# Patient Record
Sex: Female | Born: 1970
Health system: Southern US, Community
[De-identification: ages and names within clinical notes are randomized; demographics above are authoritative.]

## PROBLEM LIST (undated history)

## (undated) DIAGNOSIS — I10 Essential (primary) hypertension: Secondary | ICD-10-CM

## (undated) DIAGNOSIS — N63 Unspecified lump in unspecified breast: Secondary | ICD-10-CM

## (undated) DIAGNOSIS — Z803 Family history of malignant neoplasm of breast: Secondary | ICD-10-CM

## (undated) DIAGNOSIS — R42 Dizziness and giddiness: Secondary | ICD-10-CM

## (undated) DIAGNOSIS — K2970 Gastritis, unspecified, without bleeding: Secondary | ICD-10-CM

## (undated) DIAGNOSIS — Z800 Family history of malignant neoplasm of digestive organs: Secondary | ICD-10-CM

## (undated) DIAGNOSIS — K219 Gastro-esophageal reflux disease without esophagitis: Secondary | ICD-10-CM

## (undated) DIAGNOSIS — B009 Herpesviral infection, unspecified: Secondary | ICD-10-CM

## (undated) HISTORY — DX: Dizziness and giddiness: R42

## (undated) HISTORY — DX: Unspecified lump in unspecified breast: N63.0

## (undated) HISTORY — DX: Essential (primary) hypertension: I10

## (undated) HISTORY — DX: Gastro-esophageal reflux disease without esophagitis: K21.9

## (undated) HISTORY — DX: Herpesviral infection, unspecified: B00.9

## (undated) HISTORY — DX: Family history of malignant neoplasm of digestive organs: Z80.0

## (undated) HISTORY — DX: Family history of malignant neoplasm of breast: Z80.3

## (undated) HISTORY — DX: Gastritis, unspecified, without bleeding: K29.70

---

## 2002-03-26 DIAGNOSIS — K2970 Gastritis, unspecified, without bleeding: Secondary | ICD-10-CM

## 2002-03-26 HISTORY — DX: Gastritis, unspecified, without bleeding: K29.70

## 2006-08-25 ENCOUNTER — Emergency Department: Payer: Self-pay | Admitting: Emergency Medicine

## 2006-09-05 ENCOUNTER — Emergency Department: Payer: Self-pay | Admitting: Unknown Physician Specialty

## 2007-01-27 ENCOUNTER — Ambulatory Visit: Payer: Self-pay | Admitting: Unknown Physician Specialty

## 2007-01-30 ENCOUNTER — Ambulatory Visit: Payer: Self-pay | Admitting: Unknown Physician Specialty

## 2008-03-26 HISTORY — PX: TUBAL LIGATION: SHX77

## 2008-03-26 HISTORY — PX: ABLATION: SHX5711

## 2010-03-26 DIAGNOSIS — I10 Essential (primary) hypertension: Secondary | ICD-10-CM

## 2010-03-26 HISTORY — DX: Essential (primary) hypertension: I10

## 2011-09-24 ENCOUNTER — Ambulatory Visit: Payer: Self-pay

## 2011-10-08 HISTORY — PX: BREAST BIOPSY: SHX20

## 2012-01-27 ENCOUNTER — Emergency Department: Payer: Self-pay | Admitting: Emergency Medicine

## 2012-01-27 LAB — URINALYSIS, COMPLETE
Bacteria: NONE SEEN
Ketone: NEGATIVE
Leukocyte Esterase: NEGATIVE
Nitrite: NEGATIVE
Ph: 9 (ref 4.5–8.0)
Protein: NEGATIVE
RBC,UR: <1 /HPF (ref 0–5)

## 2012-01-27 LAB — COMPREHENSIVE METABOLIC PANEL
Albumin: 4 g/dL (ref 3.4–5.0)
Alkaline Phosphatase: 57 U/L (ref 50–136)
Anion Gap: 7 (ref 7–16)
BUN: 16 mg/dL (ref 7–18)
Creatinine: 0.85 mg/dL (ref 0.60–1.30)
EGFR (African American): >60
Glucose: 80 mg/dL (ref 65–99)
Potassium: 3.5 mmol/L (ref 3.5–5.1)
SGOT(AST): 21 U/L (ref 15–37)
Sodium: 136 mmol/L (ref 136–145)
Total Protein: 8.6 g/dL — ABNORMAL HIGH (ref 6.4–8.2)

## 2012-01-27 LAB — CBC
MCH: 28.3 pg (ref 26.0–34.0)
MCHC: 32.7 g/dL (ref 32.0–36.0)
RDW: 13.5 % (ref 11.5–14.5)

## 2012-01-27 LAB — PREGNANCY, URINE: Pregnancy Test, Urine: NEGATIVE m[IU]/mL

## 2013-09-15 DIAGNOSIS — N920 Excessive and frequent menstruation with regular cycle: Secondary | ICD-10-CM | POA: Insufficient documentation

## 2013-09-15 DIAGNOSIS — A6000 Herpesviral infection of urogenital system, unspecified: Secondary | ICD-10-CM | POA: Insufficient documentation

## 2013-09-16 ENCOUNTER — Other Ambulatory Visit: Payer: Commercial Indemnity

## 2013-09-16 ENCOUNTER — Ambulatory Visit (INDEPENDENT_AMBULATORY_CARE_PROVIDER_SITE_OTHER): Payer: Commercial Indemnity | Admitting: General Surgery

## 2013-09-16 ENCOUNTER — Encounter: Payer: Self-pay | Admitting: General Surgery

## 2013-09-16 ENCOUNTER — Ambulatory Visit: Payer: Self-pay | Admitting: General Surgery

## 2013-09-16 VITALS — BP 138/68 | HR 78 | Resp 15 | Ht 64.0 in | Wt 161.0 lb

## 2013-09-16 DIAGNOSIS — R001 Bradycardia, unspecified: Secondary | ICD-10-CM | POA: Insufficient documentation

## 2013-09-16 DIAGNOSIS — N63 Unspecified lump in unspecified breast: Secondary | ICD-10-CM

## 2013-09-16 DIAGNOSIS — N6320 Unspecified lump in the left breast, unspecified quadrant: Secondary | ICD-10-CM

## 2013-09-16 NOTE — Patient Instructions (Signed)
Breast Biopsy A breast biopsy is a procedure where a sample of breast tissue is removed from your breast. The tissue is examined under a microscope to see if cancerous cells are present. A breast biopsy is done when there is:  Any undiagnosed breast mass (tumor).  Nipple abnormalities, dimpling, crusting, or ulcerations.  Abnormal discharge from the nipple, especially blood.  Redness, swelling, and pain of the breast.  Calcium deposits (calcifications) or abnormalities seen on a mammogram, ultrasound result, or results of magnetic resonance imaging (MRI).  Suspicious changes in the breast seen on your mammogram. If the tumor is found to be cancerous (malignant), a breast biopsy can help to determine what the best treatment is for you. There are many different types of breast biopsies. Talk to your caregiver about your options and which type is best for you. LET YOUR CAREGIVER KNOW ABOUT:  Allergies to food or medicine.  Medicines taken, including vitamins, herbs, eyedrops, over-the-counter medicines, and creams.  Use of steroids (by mouth or creams).  Previous problems with anesthetics or numbing medicines.  History of bleeding problems or blood clots.  Previous surgery.  Other health problems, including diabetes and kidney problems.  Any recent colds or infections.  Possibility of pregnancy, if this applies. RISKS AND COMPLICATIONS   Bleeding.  Infection.  Allergy to medicines.  Bruising and swelling of the breast.  Alteration in the shape of the breast.  Not finding the lump or abnormality.  Needing more surgery. BEFORE THE PROCEDURE  Arrange for someone to drive you home after the procedure.  Do not smoke for 2 weeks before the procedure. Stop smoking, if you smoke.  Do not drink alcohol for 24 hours before procedure.  Wear a good support bra to the procedure. PROCEDURE  You may be given a medicine to numb the breast area (local anesthesia) or a medicine  to make you sleep (general anesthesia) during the procedure. The following are the different types of biopsies that can be performed.   Fine-needle aspiration--A thin needle is attached to a syringe and inserted into the breast lump. Fluid and cells are removed and then looked at under a microscope. If the breast lump cannot be felt, an ultrasound may be used to help locate the lump and place the needle in the correct area.   Core needle biopsy--A wide, hollow needle (core needle) is inserted into the breast lump 3-6 times to get tissue samples or cores. The samples are removed. The needle is usually placed in the correct area by using an ultrasound or X-ray.   Stereotactic biopsy--X-ray equipment and a computer are used to analyze X-ray pictures of the breast lump. The computer then finds exactly where the core needle needs to be inserted. Tissue samples are removed.   Vacuum-assisted biopsy--A small incision (less than  inch) is made in your breast. A biopsy device that includes a hollow needle and vacuum is passed through the incision and into the breast tissue. The vacuum gently draws abnormal breast tissue into the needle to remove it. This type of biopsy removes a larger tissue sample than a regular core needle biopsy. No stitches are needed, and there is usually little scarring.  Ultrasound-guided core needle biopsy--A high frequency ultrasound helps guide the core needle to the area of the mass or abnormality. An incision is made to insert the needle. Tissue samples are removed.  Open biopsy--A larger incision is made in the breast. Your caregiver will attempt to remove the whole breast lump or   as much as possible. AFTER THE PROCEDURE  You will be taken to the recovery area. If you are doing well and have no problems, you will be allowed to go home.  You may notice bruising on your breast. This is normal.  Your caregiver may apply a pressure dressing on your breast for 24-48 hours. A  pressure dressing is a bandage that is wrapped tightly around the chest to stop fluid from collecting underneath tissues. Document Released: 03/12/2005 Document Revised: 07/07/2012 Document Reviewed: 04/12/2011 Exit[employer] Patient Information 2015 [employer]. This information is not intended to replace advice given to you by your health care provider. Make sure you discuss any questions you have with your health care provider.   

## 2013-09-16 NOTE — Progress Notes (Signed)
Patient ID: Christine Hoffman, female   DOB: January 15, 1971, 43 y.o.   MRN: [id number]  Chief Complaint  Patient presents with  . Other    left breast mass    HPI Christine Hoffman is a 43 y.o. female  Here for left mass found on her annual mammogram completed at [facility] on 09/08/13. The patient denies any breast pain or tenderness and is unable to feel the mass. She normally gets annual mammograms and does monthly breast exams. Previous biopsy was done on the left breast in 2013 in this office at which time suitable angiomatous stromal hyperplasia was identified.. Patient is currently wearing a 24 hour halter monitor due to irregular heart rhythm.  The patient is accompanied today by her husband of 2 months who was present for the interview and exam. HPI  Past Medical History  Diagnosis Date  . Gastritis 2004  . Hypertension 2012  . Lump or mass in breast   . GERD (gastroesophageal reflux disease)     Past Surgical History  Procedure Laterality Date  . Ablation  2010  . Tubal ligation  2010  . 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    Family History  Problem Relation Age of Onset  . Breast cancer Mother 53  . Ovarian cancer Maternal Aunt   . Ovarian cancer  33    Niece Fathers side    Social History History  Substance Use Topics  . Smoking status: Never Smoker   . Smokeless tobacco: Never Used  . Alcohol Use: Yes    No Known Allergies  No current outpatient prescriptions on file.   No current facility-administered medications for this visit.    Review of Systems Review of Systems  Constitutional: Negative.   Respiratory: Negative.   Cardiovascular: Negative.     Blood pressure 138/68, pulse 78, resp. rate 15, height 5\' 4"  (1.626 m), weight 161 lb (73.029 kg), last menstrual period 08/28/2013.  Physical Exam Physical Exam  Constitutional: She is oriented to person, place, and time. She appears  well-developed and well-nourished.  Eyes: Conjunctivae are normal. No scleral icterus.  Neck: Neck supple.  Cardiovascular: Normal rate, regular rhythm and normal heart sounds.   Pulmonary/Chest: Effort normal and breath sounds normal. Right breast exhibits no inverted nipple, no mass, no nipple discharge, no skin change and no tenderness. Left breast exhibits mass (2 o'clock 6 cm from the nipple, 2 cm in size ). Left breast exhibits no inverted nipple, no nipple discharge, no skin change and no tenderness.    Lymphadenopathy:    She has no cervical adenopathy.    She has no axillary adenopathy.  Neurological: She is alert and oriented to person, place, and time.    Data Reviewed Mammogram dated September 08, 2013 was reviewed and compared to the 2013 study. BI-RAD-0. A large nodular density in the outer central portion left breast approximately 3 cm in diameter. Adjacent biopsy clip is seen. Diagnostic mammogram and ultrasound were recommended.   Ultrasound examination of the left breast at the 2 o'clock position, 6 cm from the nipple shows a hypoechoic smoothly lobulated 0.9 x 3.0 x 3.1 cm mass corresponding to the mammogram and clinical exam.  At the time of her October 08, 2011 exam this measured approximately 1.1 cm in diameter.  Assessment    Enlarging left breast mass, likely no change in histology.       Plan    Options for management were reviewed:  1) repeat vacuum-assisted biopsy with attempt remove the entire lesion versus 2) operative excision. Pros and cons of each were reviewed. The main difference between the 2 procedures would be the increased cost with operative reexcision but less likelihood of recurrence. At this time the patient is comfortable with plans for vacuum excision. As she is presently having a Holter monitor exam, the biopsy will be deferred until the device has been removed to minimize interference.     Ref: Mack Hook PCP: Dr Claris Gower,  Forest Gleason 09/16/2013, 8:59 PM

## 2013-09-17 ENCOUNTER — Telehealth: Payer: Self-pay | Admitting: *Deleted

## 2013-09-17 NOTE — Telephone Encounter (Signed)
Patient called back and would like to have the excision done at the hospital, she wants it completely gone, she does not want it to come back. I told her I would let Dr Bary Castilla know and start the process of getting it scheduled. Office [facility] biopsy cancelled.

## 2013-09-18 ENCOUNTER — Telehealth: Payer: Self-pay

## 2013-09-18 NOTE — Telephone Encounter (Signed)
Message left for patient to call back to schedule breast surgery.

## 2013-09-21 ENCOUNTER — Telehealth: Payer: Self-pay | Admitting: *Deleted

## 2013-09-21 NOTE — Telephone Encounter (Signed)
Spoke with patient about scheduling surgery. Patient is scheduled for surgery at [facility] on 10/01/13. She will pre admit by phone. Patient is aware to call the day before surgery to get her arrival time. Instructions have been mailed to her. Patient is aware of date and instructions.

## 2013-09-21 NOTE — Telephone Encounter (Signed)
Pt was seen in the office by Dr.Byrnett about her LT breast mass, she was wondering if she does the removal of the tissue will there be a chance it will come back.

## 2013-09-22 ENCOUNTER — Telehealth: Payer: Self-pay | Admitting: *Deleted

## 2013-09-22 ENCOUNTER — Other Ambulatory Visit: Payer: Self-pay | Admitting: General Surgery

## 2013-09-22 DIAGNOSIS — N6320 Unspecified lump in the left breast, unspecified quadrant: Secondary | ICD-10-CM

## 2013-09-22 NOTE — Telephone Encounter (Signed)
I talked with the patient continue with planned excision.

## 2013-09-22 NOTE — Telephone Encounter (Signed)
Excision is scheduled for [facility] on 10-01-13.

## 2013-09-22 NOTE — Telephone Encounter (Signed)
Pt is returning your call back, she will be in a meeting till 61

## 2013-10-01 ENCOUNTER — Ambulatory Visit: Payer: Self-pay | Admitting: General Surgery

## 2013-10-01 DIAGNOSIS — D249 Benign neoplasm of unspecified breast: Secondary | ICD-10-CM

## 2013-10-02 ENCOUNTER — Encounter: Payer: Self-pay | Admitting: General Surgery

## 2013-10-05 ENCOUNTER — Ambulatory Visit: Payer: Commercial Indemnity | Admitting: General Surgery

## 2013-10-05 LAB — PATHOLOGY REPORT

## 2013-10-06 ENCOUNTER — Encounter: Payer: Self-pay | Admitting: General Surgery

## 2013-10-06 ENCOUNTER — Telehealth: Payer: Self-pay

## 2013-10-06 NOTE — Telephone Encounter (Signed)
Message copied by Lesly Rubenstein on Tue Oct 06, 2013  8:29 AM ------      Message from: [place], Forest Gleason      Created: Tue Oct 06, 2013  7:07 AM       Notify patient biopsy of the left breast completed 7/9 was fine. Will review at f/u. Thanks. ------

## 2013-10-06 NOTE — Telephone Encounter (Signed)
Message left for patient to call back for results.

## 2013-10-06 NOTE — Telephone Encounter (Signed)
Notified patient as instructed, patient pleased. Discussed follow-up appointments, patient agrees  

## 2013-10-07 ENCOUNTER — Ambulatory Visit: Payer: Commercial Indemnity | Admitting: General Surgery

## 2013-10-07 ENCOUNTER — Encounter: Payer: Self-pay | Admitting: General Surgery

## 2013-10-13 ENCOUNTER — Ambulatory Visit (INDEPENDENT_AMBULATORY_CARE_PROVIDER_SITE_OTHER): Payer: Self-pay | Admitting: General Surgery

## 2013-10-13 ENCOUNTER — Encounter: Payer: Self-pay | Admitting: General Surgery

## 2013-10-13 VITALS — BP 110/80 | HR 64 | Resp 12 | Ht 64.0 in | Wt 163.0 lb

## 2013-10-13 DIAGNOSIS — N6320 Unspecified lump in the left breast, unspecified quadrant: Secondary | ICD-10-CM

## 2013-10-13 DIAGNOSIS — N63 Unspecified lump in unspecified breast: Secondary | ICD-10-CM

## 2013-10-13 NOTE — Patient Instructions (Addendum)
Continue self breast exams. Call office for any new breast issues or concerns. Heating pad as needed for comfort. Follow up in 3 months.

## 2013-10-13 NOTE — Progress Notes (Signed)
Patient ID: Christine Hoffman, female   DOB: 1970/10/04, 43 y.o.   MRN: [id number]  Chief Complaint  Patient presents with  . Routine Post Op    left breast biopsy    HPI Christine Hoffman is a 43 y.o. female who presents for a post op left breast biopsy. The biopsy was performed on 10/01/13. No new issues at this time.   *HPI  Past Medical History  Diagnosis Date  . Gastritis 2004  . Hypertension 2012  . Lump or mass in breast   . GERD (gastroesophageal reflux disease)     Past Surgical History  Procedure Laterality Date  . Ablation  2010  . Tubal ligation  2010  . 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    Family History  Problem Relation Age of Onset  . Breast cancer Mother 14  . Ovarian cancer Maternal Aunt   . Ovarian cancer  82    Niece Fathers side    Social History History  Substance Use Topics  . Smoking status: Never Smoker   . Smokeless tobacco: Never Used  . Alcohol Use: Yes    No Known Allergies  No current outpatient prescriptions on file.   No current facility-administered medications for this visit.    Review of Systems Review of Systems  Constitutional: Negative.   Respiratory: Negative.   Cardiovascular: Negative.     Blood pressure 110/80, pulse 64, resp. rate 12, height 5\' 4"  (1.626 m), weight 163 lb (73.936 kg), last menstrual period 09/28/2013.  Physical Exam Physical Exam  Constitutional: She is oriented to person, place, and time. She appears well-developed and well-nourished.  Pulmonary/Chest: Left breast exhibits no inverted nipple, no mass, no nipple discharge and no skin change.  Well healed incision at 3 o'clock minimal thickening noted  Neurological: She is alert and oriented to person, place, and time.  Skin: Skin is warm and dry.    Data Reviewed Biopsy dated 10/08/2011 showed cylindroma stromal hyperplasia with columnar cell changes. No evidence of  malignancy.  Left breast biopsy dated 10/01/2013 showed benign breast tissue with stromal fibrosis, benign breast lobules and admixed fat. Findings consistent with hamartoma or nodular fibroadenomatoid changes.  Assessment    Doing well status post excision of the left breast mass.    Plan    Local wound care options reviewed.    Heating pad as needed for comfort. Follow up in 3 months.   PCP: Philbert Riser., Gaye Pollack 10/14/2013, 12:52 PM

## 2014-01-18 ENCOUNTER — Encounter: Payer: Self-pay | Admitting: General Surgery

## 2014-01-18 ENCOUNTER — Ambulatory Visit (INDEPENDENT_AMBULATORY_CARE_PROVIDER_SITE_OTHER): Payer: Self-pay | Admitting: General Surgery

## 2014-01-18 VITALS — BP 116/70 | HR 72 | Resp 14 | Ht 65.0 in | Wt 160.0 lb

## 2014-01-18 DIAGNOSIS — N63 Unspecified lump in breast: Secondary | ICD-10-CM

## 2014-01-18 DIAGNOSIS — N6320 Unspecified lump in the left breast, unspecified quadrant: Secondary | ICD-10-CM

## 2014-01-18 NOTE — Progress Notes (Signed)
Patient ID: Christine Hoffman, female   DOB: [birth year], 43 y.o.   MRN: [id number]  Chief Complaint  Patient presents with  . Follow-up    3 month follow up breast    HPI Christine Hoffman is a 43 y.o. female who presents for a 3 month follow up of left breast fibroadenoma. The patient underwent a left breast excision on 10/01/13. No new complaints at this time. Doing well.   HPI  Past Medical History  Diagnosis Date  . Gastritis 2004  . Hypertension 2012  . Lump or mass in breast   . GERD (gastroesophageal reflux disease)     Past Surgical History  Procedure Laterality Date  . Ablation  2010  . Tubal ligation  2010  . 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    Family History  Problem Relation Age of Onset  . Breast cancer Mother 15  . Ovarian cancer Maternal Aunt   . Ovarian cancer  67    Niece Fathers side    Social History History  Substance Use Topics  . Smoking status: Never Smoker   . Smokeless tobacco: Never Used  . Alcohol Use: Yes    No Known Allergies  No current outpatient prescriptions on file.   No current facility-administered medications for this visit.    Review of Systems Review of Systems  Constitutional: Negative.   Respiratory: Negative.   Cardiovascular: Negative.     Blood pressure 116/70, pulse 72, resp. rate 14, height 5\' 5"  (1.651 m), weight 160 lb (72.576 kg), last menstrual period 01/18/2014.  Physical Exam Physical Exam  Constitutional: She is oriented to person, place, and time. She appears well-developed and well-nourished.  Eyes: Conjunctivae are normal. No scleral icterus.  Neck: Neck supple.  Cardiovascular: Normal rate and normal heart sounds.  An irregular rhythm present.  Pulmonary/Chest: Effort normal and breath sounds normal. Right breast exhibits no inverted nipple, no mass, no nipple discharge, no skin change and no tenderness. Left breast exhibits no  inverted nipple, no mass, no nipple discharge, no skin change and no tenderness.    Left breast thickening at 3 o 'clock site of recent excision.  Lymphadenopathy:    She has no cervical adenopathy.    She has no axillary adenopathy.  Neurological: She is alert and oriented to person, place, and time.  Skin: Skin is warm and dry.    Data Reviewed 10/01/2013 left breast biopsy showed benign breast tissue with stromal fibrosis with admixed fat. Benign hamartoma and nodular fibroadenomatoid changes were likely.  Assessment    Benign breast exam.    Plan    Patient to return as needed.     PCP: Philbert Riser., Gaye Pollack 01/18/2014, 7:47 PM

## 2014-01-18 NOTE — Patient Instructions (Signed)
Patient to return as needed. 

## 2014-01-25 ENCOUNTER — Encounter: Payer: Self-pay | Admitting: General Surgery

## 2014-05-30 ENCOUNTER — Ambulatory Visit: Payer: Self-pay | Admitting: Emergency Medicine

## 2014-05-30 ENCOUNTER — Emergency Department: Payer: Self-pay | Admitting: Emergency Medicine

## 2014-06-02 ENCOUNTER — Telehealth: Payer: Self-pay

## 2014-06-02 DIAGNOSIS — R110 Nausea: Secondary | ICD-10-CM | POA: Insufficient documentation

## 2014-06-02 NOTE — Telephone Encounter (Signed)
[place] called back, states tomorrow was "not good enough" per pt, and she has gotten an earlier appt with Dr. Ubaldo Glassing.

## 2014-06-02 NOTE — Telephone Encounter (Signed)
Ok

## 2014-06-02 NOTE — Telephone Encounter (Signed)
Spoke w/ Amy, NP at Dr. Kathaleen Grinder office.  She reports that pt was seen in the ED recently, primary cardiologist [facility], but they are unhappy w/ them and would like to switch to our office.  EKG performed in their office today shows bigeminy and they would like to see if pt can be worked in sooner than Friday 3/11.  Advised her that I will make Dr. Fletcher Anon aware and call her back if pt can be seen sooner.

## 2014-06-04 ENCOUNTER — Ambulatory Visit: Payer: Self-pay | Admitting: Cardiovascular Disease

## 2014-06-04 DIAGNOSIS — I493 Ventricular premature depolarization: Secondary | ICD-10-CM | POA: Insufficient documentation

## 2014-07-07 NOTE — Telephone Encounter (Signed)
This encounter was created in error - please disregard.

## 2014-07-17 NOTE — Op Note (Signed)
PATIENT NAME:  Christine Hoffman, Christine Hoffman MR#:  [id number] DATE OF BIRTH:  January 21, 1971  DATE OF PROCEDURE:  10/01/2013  PREOPERATIVE DIAGNOSIS: Left breast fibroadenoma.   POSTOPERATIVE DIAGNOSIS:   Left breast fibroadenoma.   OPERATIVE PROCEDURE: Excision of left breast fibroadenoma.   SURGEON: Hervey Ard, MD    ANESTHESIA: General by LMA under Dr. Marcello Moores, Marcaine 0.5% with 1:200,000 units of epinephrine, 30 mL local infiltration.  CLINICAL NOTE: A 44 year old woman had previously undergone core biopsy of a left breast mass in the 3 o'clock position showing evidence of a fibroadenoma. The area has enlarged over time and she desired formal excision.   OPERATIVE NOTE: With the patient under adequate general anesthesia, the breast was prepped with ChloraPrep and draped. Ultrasound was used to confirm the location. Marcaine was infiltrated for postoperative analgesia and hemostasis. A radial incision was made at the 3 o'clock position of the left breast and the skin and subcutaneous tissue divided.  A layer of adipose tissue was excised and then the mass itself was approached. This was approximately 3 cm in maximal diameter. It was orientated and sent fresh per protocol for analysis. The wound was closed in layers with 2-0 Vicryl figure-of-eight sutures. The skin was closed with a running 4-0 Vicryl subcuticular suture. Benzoin and Steri-Strips were applied followed by a Telfa pad, fluff gauze, Kerlix, and an Ace wrap.   The patient tolerated the procedure well.    ____________________________ Robert Bellow, MD jwb:dd D: 10/01/2013 21:04:32 ET T: 10/02/2013 02:36:18 ET JOB#: [id number]  cc: Robert Bellow, MD, <Dictator> Arlis Porta., MD Jesus Genera. Danise Mina, CNM Lillyona Polasek Amedeo Kinsman MD ELECTRONICALLY SIGNED 10/02/2013 13:05

## 2015-03-29 ENCOUNTER — Ambulatory Visit: Payer: Self-pay | Admitting: Family Medicine

## 2015-04-06 ENCOUNTER — Ambulatory Visit (INDEPENDENT_AMBULATORY_CARE_PROVIDER_SITE_OTHER): Payer: Managed Care, Other (non HMO) | Admitting: Family Medicine

## 2015-04-06 VITALS — BP 131/85 | HR 85 | Temp 98.2°F | Resp 16 | Ht 64.0 in | Wt 162.0 lb

## 2015-04-06 DIAGNOSIS — R5382 Chronic fatigue, unspecified: Secondary | ICD-10-CM | POA: Diagnosis not present

## 2015-04-06 DIAGNOSIS — R42 Dizziness and giddiness: Secondary | ICD-10-CM | POA: Diagnosis not present

## 2015-04-06 DIAGNOSIS — I493 Ventricular premature depolarization: Secondary | ICD-10-CM | POA: Diagnosis not present

## 2015-04-06 DIAGNOSIS — R110 Nausea: Secondary | ICD-10-CM

## 2015-04-06 MED ORDER — ONDANSETRON HCL 4 MG PO TABS: 4.0000 mg | ORAL_TABLET | Freq: Three times a day (TID) | ORAL | Status: DC | PRN

## 2015-04-06 MED ORDER — FLUTICASONE PROPIONATE 50 MCG/ACT NA SUSP: 2.0000 | Freq: Every day | NASAL | Status: DC

## 2015-04-06 MED ORDER — PSEUDOEPHEDRINE HCL 30 MG PO TABS: 30.0000 mg | ORAL_TABLET | ORAL | Status: DC | PRN

## 2015-04-06 NOTE — Patient Instructions (Signed)
We will check some labs today to determine the course of your symptoms. Please continue with Dr. Sonny Masters recommendations. We will consider PT for your symptoms if they don't improve.  Please seek immediate medical attention at ER or Urgent Care if you develop: Chest pain, pressure or tightness. Shortness of breath accompanied by nausea or diaphoresis Visual changes Numbness or tingling on one side of the body Facial droop Altered mental status Or any concerning symptoms.

## 2015-04-06 NOTE — Assessment & Plan Note (Signed)
Pt advised to follow advise of her ENT advice to help with symptoms. Check labs to rule out other causes of dizziness and nausea.  Consider vestibular rehab.

## 2015-04-06 NOTE — Progress Notes (Signed)
Subjective:    Patient ID: Christine Hoffman, female    DOB: [birth year], 45 y.o.   MRN: [id number]  HPI: Christine Hoffman is a 45 y.o. female presenting on 04/06/2015 for Nausea   HPI  Pt presents for nausea. Wakes up in the morning feeling dizzy and nauseated. Sometimes nauseated with position changes. Saw ENT for vertigo- taking sudafed and nasal spray. Called Dr. Virgia Land  Told her to start flonase and sudafed for symptoms. Mild relief- however symptoms still cause nausea.  Pt is still having palpitations- had a holter monitor. Was throwing frequent PVCs per Dr. Bethanne Ginger assessment. Symptoms are still present but occurring less frequently. No CP, SOB.  No syncope. Occasional palpitations when she is stressed. Not occurring daily.   Pt also reporting feeling fatigued frequently.  Her husband terms it low on gas.    Past Medical History  Diagnosis Date  . Gastritis 2004  . Hypertension 2012  . Lump or mass in breast   . GERD (gastroesophageal reflux disease)     No current outpatient prescriptions on file prior to visit.   No current facility-administered medications on file prior to visit.    Review of Systems  Constitutional: Negative for fever and chills.  HENT: Negative.  Negative for ear discharge and ear pain.   Respiratory: Negative for cough, chest tightness and wheezing.   Cardiovascular: Positive for palpitations (occasional. ). Negative for chest pain and leg swelling.  Gastrointestinal: Positive for nausea. Negative for vomiting, abdominal pain, diarrhea and constipation.  Endocrine: Negative.  Negative for cold intolerance, heat intolerance, polydipsia, polyphagia and polyuria.  Genitourinary: Negative for dysuria and difficulty urinating.  Musculoskeletal: Negative.   Neurological: Positive for dizziness. Negative for light-headedness and numbness.  Psychiatric/Behavioral: Negative.    Per HPI unless specifically indicated above     Objective:      BP 131/85 mmHg  Pulse 85  Temp(Src) 98.2 F (36.8 C) (Oral)  Resp 16  Ht 5\' 4"  (1.626 m)  Wt 162 lb (73.483 kg)  BMI 27.79 kg/m2  LMP 03/02/2015  Wt Readings from Last 3 Encounters:  04/06/15 162 lb (73.483 kg)  01/18/14 160 lb (72.576 kg)  10/13/13 163 lb (73.936 kg)    Physical Exam  Constitutional: She is oriented to person, place, and time. She appears well-developed and well-nourished.  HENT:  Head: Normocephalic and atraumatic.  Right Ear: Hearing and tympanic membrane normal.  Left Ear: Hearing normal. Tympanic membrane is scarred.  Nose: Nose normal. Right sinus exhibits no maxillary sinus tenderness and no frontal sinus tenderness. Left sinus exhibits no maxillary sinus tenderness and no frontal sinus tenderness.  Eyes: EOM are normal. Pupils are equal, round, and reactive to light. Right eye exhibits no nystagmus. Left eye exhibits no nystagmus.  Neck: Neck supple.  Cardiovascular: Normal rate, regular rhythm and normal heart sounds.  Exam reveals no gallop and no friction rub.   No murmur heard. Pulmonary/Chest: Effort normal and breath sounds normal. She has no wheezes. She exhibits no tenderness.  Abdominal: Soft. Normal appearance and bowel sounds are normal. She exhibits no distension and no mass. There is no tenderness. There is no rebound and no guarding.  Musculoskeletal: Normal range of motion. She exhibits no edema or tenderness.  Lymphadenopathy:    She has no cervical adenopathy.  Neurological: She is alert and oriented to person, place, and time.  Skin: Skin is warm and dry.       Assessment & Plan:   Problem List  Items Addressed This Visit      Cardiovascular and Mediastinum   Beat, premature ventricular - Primary    Appear controlled at this time. Consider cardiology referral for further work-up if symptoms are not improved.  Consider beta blocker. Encouraged decreasing caffeine and maintaining hydration.       Relevant Orders   TSH      Other   Vertigo    Pt advised to follow advise of her ENT advice to help with symptoms. Check labs to rule out other causes of dizziness and nausea.  Consider vestibular rehab.        Other Visit Diagnoses    Nausea        Likely 2/2 vertigo. PRN Zofran.     Relevant Medications    ondansetron (ZOFRAN) 4 MG tablet    Other Relevant Orders    Comprehensive Metabolic Panel (CMET)    CBC with Differential    Chronic fatigue        Check TSH and Vitamin D levels.     Relevant Orders    TSH    VITAMIN D 25 Hydroxy (Vit-D Deficiency, Fractures)       Meds ordered this encounter  Medications  . acyclovir (ZOVIRAX) 400 MG tablet    Sig: Take 400 mg by mouth 5 (five) times daily. As needed  . ondansetron (ZOFRAN) 4 MG tablet    Sig: Take 1 tablet (4 mg total) by mouth every 8 (eight) hours as needed for nausea or vomiting.    Dispense:  20 tablet    Refill:  0    Order Specific Question:  Supervising Provider    Answer:  Arlis Porta [phone]  . pseudoephedrine (SUDAFED) 30 MG tablet    Sig: Take 1 tablet (30 mg total) by mouth every 4 (four) hours as needed for congestion.    Dispense:  30 tablet    Refill:  0    Order Specific Question:  Supervising Provider    Answer:  Arlis Porta [phone]  . fluticasone (FLONASE) 50 MCG/ACT nasal spray    Sig: Place 2 sprays into both nostrils daily.    Dispense:  16 g    Refill:  11    Order Specific Question:  Supervising Provider    Answer:  Arlis Porta [phone]      Follow up plan: Return in about 4 weeks (around 05/04/2015) for dizziness. Marland Kitchen

## 2015-04-06 NOTE — Assessment & Plan Note (Signed)
Appear controlled at this time. Consider cardiology referral for further work-up if symptoms are not improved.  Consider beta blocker. Encouraged decreasing caffeine and maintaining hydration.

## 2015-04-09 LAB — CBC WITH DIFFERENTIAL/PLATELET
BASOS: 0 %
Basophils Absolute: 0 10*3/uL (ref 0.0–0.2)
EOS (ABSOLUTE): 0.1 10*3/uL (ref 0.0–0.4)
Eos: 2 %
HEMATOCRIT: 39 % (ref 34.0–46.6)
HEMOGLOBIN: 12.9 g/dL (ref 11.1–15.9)
IMMATURE GRANULOCYTES: 0 %
Immature Grans (Abs): 0 10*3/uL (ref 0.0–0.1)
Lymphocytes Absolute: 1.8 10*3/uL (ref 0.7–3.1)
Lymphs: 39 %
MCH: 27.6 pg (ref 26.6–33.0)
MCHC: 33.1 g/dL (ref 31.5–35.7)
MCV: 84 fL (ref 79–97)
MONOCYTES: 6 %
Monocytes Absolute: 0.3 10*3/uL (ref 0.1–0.9)
NEUTROS PCT: 53 %
Neutrophils Absolute: 2.5 10*3/uL (ref 1.4–7.0)
Platelets: 231 10*3/uL (ref 150–379)
RBC: 4.67 x10E6/uL (ref 3.77–5.28)
RDW: 13.4 % (ref 12.3–15.4)
WBC: 4.7 10*3/uL (ref 3.4–10.8)

## 2015-04-09 LAB — COMPREHENSIVE METABOLIC PANEL
ALK PHOS: 49 IU/L (ref 39–117)
ALT: 13 IU/L (ref 0–32)
AST: 16 IU/L (ref 0–40)
Albumin/Globulin Ratio: 1.2 (ref 1.1–2.5)
Albumin: 4.1 g/dL (ref 3.5–5.5)
BUN/Creatinine Ratio: 13 (ref 9–23)
BUN: 11 mg/dL (ref 6–24)
Bilirubin Total: 0.3 mg/dL (ref 0.0–1.2)
CALCIUM: 9 mg/dL (ref 8.7–10.2)
CO2: 21 mmol/L (ref 18–29)
Chloride: 98 mmol/L (ref 96–106)
Creatinine, Ser: 0.82 mg/dL (ref 0.57–1.00)
GFR calc Af Amer: 101 mL/min/{1.73_m2} (ref >59)
GFR, EST NON AFRICAN AMERICAN: 88 mL/min/{1.73_m2} (ref >59)
GLUCOSE: 92 mg/dL (ref 65–99)
Globulin, Total: 3.5 g/dL (ref 1.5–4.5)
Potassium: 3.9 mmol/L (ref 3.5–5.2)
Sodium: 136 mmol/L (ref 134–144)
TOTAL PROTEIN: 7.6 g/dL (ref 6.0–8.5)

## 2015-04-09 LAB — TSH: TSH: 3.19 u[IU]/mL (ref 0.450–4.500)

## 2015-04-09 LAB — VITAMIN D 25 HYDROXY (VIT D DEFICIENCY, FRACTURES): VIT D 25 HYDROXY: 13.1 ng/mL — AB (ref 30.0–100.0)

## 2015-04-11 ENCOUNTER — Other Ambulatory Visit: Payer: Self-pay | Admitting: Family Medicine

## 2015-04-11 DIAGNOSIS — E559 Vitamin D deficiency, unspecified: Secondary | ICD-10-CM

## 2015-04-11 MED ORDER — ERGOCALCIFEROL 1.25 MG (50000 UT) PO CAPS: 50000.0000 [IU] | ORAL_CAPSULE | ORAL | Status: AC

## 2015-10-14 ENCOUNTER — Encounter: Payer: Self-pay | Admitting: Family Medicine

## 2015-10-14 ENCOUNTER — Ambulatory Visit (INDEPENDENT_AMBULATORY_CARE_PROVIDER_SITE_OTHER): Payer: Managed Care, Other (non HMO) | Admitting: Family Medicine

## 2015-10-14 VITALS — BP 138/82 | HR 41 | Temp 97.9°F | Resp 16 | Ht 64.0 in | Wt 161.8 lb

## 2015-10-14 DIAGNOSIS — A6000 Herpesviral infection of urogenital system, unspecified: Secondary | ICD-10-CM

## 2015-10-14 DIAGNOSIS — R42 Dizziness and giddiness: Secondary | ICD-10-CM | POA: Diagnosis not present

## 2015-10-14 DIAGNOSIS — E559 Vitamin D deficiency, unspecified: Secondary | ICD-10-CM

## 2015-10-14 DIAGNOSIS — N920 Excessive and frequent menstruation with regular cycle: Secondary | ICD-10-CM | POA: Diagnosis not present

## 2015-10-14 DIAGNOSIS — R5382 Chronic fatigue, unspecified: Secondary | ICD-10-CM | POA: Diagnosis not present

## 2015-10-14 DIAGNOSIS — Z0000 Encounter for general adult medical examination without abnormal findings: Secondary | ICD-10-CM | POA: Diagnosis not present

## 2015-10-14 LAB — CBC WITH DIFFERENTIAL/PLATELET
Basophils Absolute: 44 cells/uL (ref 0–200)
Basophils Relative: 1 %
Eosinophils Absolute: 88 cells/uL (ref 15–500)
Eosinophils Relative: 2 %
HCT: 39.2 % (ref 35.0–45.0)
Hemoglobin: 12.6 g/dL (ref 11.7–15.5)
Lymphocytes Relative: 39 %
Lymphs Abs: 1716 cells/uL (ref 850–3900)
MCH: 26.6 pg — ABNORMAL LOW (ref 27.0–33.0)
MCHC: 32.1 g/dL (ref 32.0–36.0)
MCV: 82.7 fL (ref 80.0–100.0)
MPV: 11.2 fL (ref 7.5–12.5)
Monocytes Absolute: 352 cells/uL (ref 200–950)
Monocytes Relative: 8 %
Neutro Abs: 2200 cells/uL (ref 1500–7800)
Neutrophils Relative %: 50 %
Platelets: 218 10*3/uL (ref 140–400)
RBC: 4.74 MIL/uL (ref 3.80–5.10)
RDW: 13.5 % (ref 11.0–15.0)
WBC: 4.4 10*3/uL (ref 3.8–10.8)

## 2015-10-14 LAB — TSH: TSH: 2.18 mIU/L

## 2015-10-14 LAB — VITAMIN B12: Vitamin B-12: 428 pg/mL (ref 200–1100)

## 2015-10-14 MED ORDER — ACYCLOVIR 400 MG PO TABS: 400.0000 mg | ORAL_TABLET | Freq: Every day | ORAL | Status: DC

## 2015-10-14 MED ORDER — IBUPROFEN 600 MG PO TABS: 600.0000 mg | ORAL_TABLET | Freq: Three times a day (TID) | ORAL | Status: DC | PRN

## 2015-10-14 MED ORDER — MECLIZINE HCL 32 MG PO TABS: 32.0000 mg | ORAL_TABLET | Freq: Three times a day (TID) | ORAL | Status: AC | PRN

## 2015-10-14 NOTE — Progress Notes (Signed)
Subjective:    Patient ID: Christine Hoffman, female    DOB: January 28, 1971, 45 y.o.   MRN: [id number]  HPI: Christine Hoffman is a 45 y.o. female presenting on 10/14/2015 for Annual Exam   HPI  Pt presents for annual exam. Overall doing well. Had Pap last year at [facility]. Mammogram done at [facility]. Breast mass removed in 2015. LMP-08/27/2015- current period is 2 weeks late. Had tubal ligation. History fibroids. Painful cramping with periods.  Still has occasional dizziness with position changes.  Working in [place]. Some days has low energy and dizziness.  TDAP- <10 years.   Past Medical History  Diagnosis Date  . Gastritis 2004  . Hypertension 2012  . Lump or mass in breast   . GERD (gastroesophageal reflux disease)    Social History   Social History  . Marital Status: Single    Spouse Name: N/A  . Number of Children: N/A  . Years of Education: N/A   Occupational History  . Not on file.   Social History Main Topics  . Smoking status: Never Smoker   . Smokeless tobacco: Never Used  . Alcohol Use: Yes  . Drug Use: No  . Sexual Activity: Yes     Comment: Tubal ligation   Other Topics Concern  . Not on file   Social History Narrative   Family History  Problem Relation Age of Onset  . Breast cancer Mother 2  . Hypertension Mother   . Cancer Mother 52    breast  . Ovarian cancer Maternal Aunt   . Ovarian cancer  48    Niece Fathers side  . Parkinson's disease Father    Current Outpatient Prescriptions on File Prior to Visit  Medication Sig  . ergocalciferol (VITAMIN D2) 50000 units capsule Take 1 capsule (50,000 Units total) by mouth once a week.  . fluticasone (FLONASE) 50 MCG/ACT nasal spray Place 2 sprays into both nostrils daily.  . ondansetron (ZOFRAN) 4 MG tablet Take 1 tablet (4 mg total) by mouth every 8 (eight) hours as needed for nausea or vomiting.  . pseudoephedrine (SUDAFED) 30 MG tablet Take 1 tablet (30 mg total) by mouth  every 4 (four) hours as needed for congestion.   No current facility-administered medications on file prior to visit.    Review of Systems  Constitutional: Positive for fatigue. Negative for fever and chills.  HENT: Negative.   Respiratory: Negative for cough, chest tightness and wheezing.   Cardiovascular: Negative for chest pain and leg swelling.  Gastrointestinal: Negative for nausea, vomiting, abdominal pain, diarrhea and constipation.  Endocrine: Negative.  Negative for cold intolerance, heat intolerance, polydipsia, polyphagia and polyuria.  Genitourinary: Negative for dysuria and difficulty urinating.  Musculoskeletal: Negative.   Neurological: Positive for dizziness. Negative for light-headedness and numbness.  Psychiatric/Behavioral: Negative.    Per HPI unless specifically indicated above     Objective:    BP 138/82 mmHg  Pulse 41  Temp(Src) 97.9 F (36.6 C) (Oral)  Resp 16  Ht 5\' 4"  (1.626 m)  Wt 161 lb 12.8 oz (73.392 kg)  BMI 27.76 kg/m2  Wt Readings from Last 3 Encounters:  10/14/15 161 lb 12.8 oz (73.392 kg)  04/06/15 162 lb (73.483 kg)  01/18/14 160 lb (72.576 kg)    Physical Exam  Constitutional: She is oriented to person, place, and time. She appears well-developed and well-nourished.  HENT:  Head: Normocephalic and atraumatic.  Right Ear: Tympanic membrane is scarred.  Left Ear: Tympanic membrane is  scarred.  Neck: Neck supple.  Cardiovascular: Normal rate, regular rhythm and normal heart sounds.  Exam reveals no gallop and no friction rub.   No murmur heard. Pulmonary/Chest: Effort normal and breath sounds normal. She has no wheezes. She exhibits no tenderness.  Abdominal: Soft. Normal appearance and bowel sounds are normal. She exhibits no distension and no mass. There is no tenderness. There is no rebound and no guarding.  Musculoskeletal: Normal range of motion. She exhibits no edema or tenderness.  Lymphadenopathy:    She has no cervical  adenopathy.  Neurological: She is alert and oriented to person, place, and time.  Skin: Skin is warm and dry.   Results for orders placed or performed in visit on 04/06/15  Comprehensive Metabolic Panel (CMET)  Result Value Ref Range   Glucose 92 65 - 99 mg/dL   BUN 11 6 - 24 mg/dL   Creatinine, Ser 0.82 0.57 - 1.00 mg/dL   GFR calc non Af Amer 88 >59 mL/min/1.73   GFR calc Af Amer 101 >59 mL/min/1.73   BUN/Creatinine Ratio 13 9 - 23   Sodium 136 134 - 144 mmol/L   Potassium 3.9 3.5 - 5.2 mmol/L   Chloride 98 96 - 106 mmol/L   CO2 21 18 - 29 mmol/L   Calcium 9.0 8.7 - 10.2 mg/dL   Total Protein 7.6 6.0 - 8.5 g/dL   Albumin 4.1 3.5 - 5.5 g/dL   Globulin, Total 3.5 1.5 - 4.5 g/dL   Albumin/Globulin Ratio 1.2 1.1 - 2.5   Bilirubin Total 0.3 0.0 - 1.2 mg/dL   Alkaline Phosphatase 49 39 - 117 IU/L   AST 16 0 - 40 IU/L   ALT 13 0 - 32 IU/L  CBC with Differential  Result Value Ref Range   WBC 4.7 3.4 - 10.8 x10E3/uL   RBC 4.67 3.77 - 5.28 x10E6/uL   Hemoglobin 12.9 11.1 - 15.9 g/dL   Hematocrit 39.0 34.0 - 46.6 %   MCV 84 79 - 97 fL   MCH 27.6 26.6 - 33.0 pg   MCHC 33.1 31.5 - 35.7 g/dL   RDW 13.4 12.3 - 15.4 %   Platelets 231 150 - 379 x10E3/uL   Neutrophils 53 %   Lymphs 39 %   Monocytes 6 %   Eos 2 %   Basos 0 %   Neutrophils Absolute 2.5 1.4 - 7.0 x10E3/uL   Lymphocytes Absolute 1.8 0.7 - 3.1 x10E3/uL   Monocytes Absolute 0.3 0.1 - 0.9 x10E3/uL   EOS (ABSOLUTE) 0.1 0.0 - 0.4 x10E3/uL   Basophils Absolute 0.0 0.0 - 0.2 x10E3/uL   Immature Granulocytes 0 %   Immature Grans (Abs) 0.0 0.0 - 0.1 x10E3/uL  TSH  Result Value Ref Range   TSH 3.190 0.450 - 4.500 uIU/mL  VITAMIN D 25 Hydroxy (Vit-D Deficiency, Fractures)  Result Value Ref Range   Vit D, 25-Hydroxy 13.1 (L) 30.0 - 100.0 ng/mL      Assessment & Plan:   Problem List Items Addressed This Visit      Genitourinary   Genital herpes    Renewed PRN acyclovir.       Relevant Medications   acyclovir  (ZOVIRAX) 400 MG tablet     Other   Excess, menstruation    Check CBC to r/o anemia. Trial of ibuprofen as needed for cramping and discomfort. Pt will discuss OCP's at her GYN appt.       Relevant Medications   ibuprofen (ADVIL,MOTRIN) 600 MG tablet  Other Relevant Orders   TSH   CBC with Differential/Platelet   Vertigo    PRN meclizine for dizziness. Consider vestibular rehab.       Relevant Medications   meclizine (ANTIVERT) 32 MG tablet   Vitamin D deficiency    Recheck vitamin D levels today. Consider daily 2000IU repletion.       Relevant Orders   VITAMIN D 25 Hydroxy (Vit-D Deficiency, Fractures)    Other Visit Diagnoses    Preventative health care    -  Primary    Reviewed healthcare maintenance with patient.     Relevant Orders    COMPLETE METABOLIC PANEL WITH GFR    Lipid Profile    Chronic fatigue        Check TSH and B12.     Relevant Orders    TSH    Vitamin B12       Meds ordered this encounter  Medications  . meclizine (ANTIVERT) 32 MG tablet    Sig: Take 1 tablet (32 mg total) by mouth 3 (three) times daily as needed.    Dispense:  30 tablet    Refill:  11    Order Specific Question:  Supervising Provider    Answer:  Arlis Porta [phone]  . acyclovir (ZOVIRAX) 400 MG tablet    Sig: Take 1 tablet (400 mg total) by mouth 5 (five) times daily. As needed    Dispense:  30 tablet    Refill:  11    Order Specific Question:  Supervising Provider    Answer:  Arlis Porta [phone]  . ibuprofen (ADVIL,MOTRIN) 600 MG tablet    Sig: Take 1 tablet (600 mg total) by mouth every 8 (eight) hours as needed.    Dispense:  30 tablet    Refill:  2    Order Specific Question:  Supervising Provider    Answer:  Arlis Porta [phone]      Follow up plan: Return in about 1 year (around 10/13/2016), or if symptoms worsen or fail to improve.

## 2015-10-14 NOTE — Patient Instructions (Signed)
Health Maintenance, Female Adopting a healthy lifestyle and getting preventive care can go a long way to promote health and wellness. Talk with your health care provider about what schedule of regular examinations is right for you. This is a good chance for you to check in with your provider about disease prevention and staying healthy. In between checkups, there are plenty of things you can do on your own. Experts have done a lot of research about which lifestyle changes and preventive measures are most likely to keep you healthy. Ask your health care provider for more information. WEIGHT AND DIET  Eat a healthy diet  Be sure to include plenty of vegetables, fruits, low-fat dairy products, and lean protein.  Do not eat a lot of foods high in solid fats, added sugars, or salt.  Get regular exercise. This is one of the most important things you can do for your health.  Most adults should exercise for at least 150 minutes each week. The exercise should increase your heart rate and make you sweat (moderate-intensity exercise).  Most adults should also do strengthening exercises at least twice a week. This is in addition to the moderate-intensity exercise.  Maintain a healthy weight  Body mass index (BMI) is a measurement that can be used to identify possible weight problems. It estimates body fat based on height and weight. Your health care provider can help determine your BMI and help you achieve or maintain a healthy weight.  For females 20 years of age and older:   A BMI below 18.5 is considered underweight.  A BMI of 18.5 to 24.9 is normal.  A BMI of 25 to 29.9 is considered overweight.  A BMI of 30 and above is considered obese.  Watch levels of cholesterol and blood lipids  You should start having your blood tested for lipids and cholesterol at 44 years of age, then have this test every 5 years.  You may need to have your cholesterol levels checked more often if:  Your lipid  or cholesterol levels are high.  You are older than 44 years of age.  You are at high risk for heart disease.  CANCER SCREENING   Lung Cancer  Lung cancer screening is recommended for adults 55-80 years old who are at high risk for lung cancer because of a history of smoking.  A yearly low-dose CT scan of the lungs is recommended for people who:  Currently smoke.  Have quit within the past 15 years.  Have at least a 30-pack-year history of smoking. A pack year is smoking an average of one pack of cigarettes a day for 1 year.  Yearly screening should continue until it has been 15 years since you quit.  Yearly screening should stop if you develop a health problem that would prevent you from having lung cancer treatment.  Breast Cancer  Practice breast self-awareness. This means understanding how your breasts normally appear and feel.  It also means doing regular breast self-exams. Let your health care provider know about any changes, no matter how small.  If you are in your 20s or 30s, you should have a clinical breast exam (CBE) by a health care provider every 1-3 years as part of a regular health exam.  If you are 40 or older, have a CBE every year. Also consider having a breast X-ray (mammogram) every year.  If you have a family history of breast cancer, talk to your health care provider about genetic screening.  If you   are at high risk for breast cancer, talk to your health care provider about having an MRI and a mammogram every year.  Breast cancer gene (BRCA) assessment is recommended for women who have family members with BRCA-related cancers. BRCA-related cancers include:  Breast.  Ovarian.  Tubal.  Peritoneal cancers.  Results of the assessment will determine the need for genetic counseling and BRCA1 and BRCA2 testing. Cervical Cancer Your health care provider may recommend that you be screened regularly for cancer of the pelvic organs (ovaries, uterus, and  vagina). This screening involves a pelvic examination, including checking for microscopic changes to the surface of your cervix (Pap test). You may be encouraged to have this screening done every 3 years, beginning at age 21.  For women ages 30-65, health care providers may recommend pelvic exams and Pap testing every 3 years, or they may recommend the Pap and pelvic exam, combined with testing for human papilloma virus (HPV), every 5 years. Some types of HPV increase your risk of cervical cancer. Testing for HPV may also be done on women of any age with unclear Pap test results.  Other health care providers may not recommend any screening for nonpregnant women who are considered low risk for pelvic cancer and who do not have symptoms. Ask your health care provider if a screening pelvic exam is right for you.  If you have had past treatment for cervical cancer or a condition that could lead to cancer, you need Pap tests and screening for cancer for at least 20 years after your treatment. If Pap tests have been discontinued, your risk factors (such as having a new sexual partner) need to be reassessed to determine if screening should resume. Some women have medical problems that increase the chance of getting cervical cancer. In these cases, your health care provider may recommend more frequent screening and Pap tests. Colorectal Cancer  This type of cancer can be detected and often prevented.  Routine colorectal cancer screening usually begins at 44 years of age and continues through 44 years of age.  Your health care provider may recommend screening at an earlier age if you have risk factors for colon cancer.  Your health care provider may also recommend using home test kits to check for hidden blood in the stool.  A small camera at the end of a tube can be used to examine your colon directly (sigmoidoscopy or colonoscopy). This is done to check for the earliest forms of colorectal  cancer.  Routine screening usually begins at age 50.  Direct examination of the colon should be repeated every 5-10 years through 44 years of age. However, you may need to be screened more often if early forms of precancerous polyps or small growths are found. Skin Cancer  Check your skin from head to toe regularly.  Tell your health care provider about any new moles or changes in moles, especially if there is a change in a mole's shape or color.  Also tell your health care provider if you have a mole that is larger than the size of a pencil eraser.  Always use sunscreen. Apply sunscreen liberally and repeatedly throughout the day.  Protect yourself by wearing long sleeves, pants, a wide-brimmed hat, and sunglasses whenever you are outside. HEART DISEASE, DIABETES, AND HIGH BLOOD PRESSURE   High blood pressure causes heart disease and increases the risk of stroke. High blood pressure is more likely to develop in:  People who have blood pressure in the high end   of the normal range (130-139/85-89 mm Hg).  People who are overweight or obese.  People who are African American.  If you are 38-23 years of age, have your blood pressure checked every 3-5 years. If you are 61 years of age or older, have your blood pressure checked every year. You should have your blood pressure measured twice--once when you are at a hospital or clinic, and once when you are not at a hospital or clinic. Record the average of the two measurements. To check your blood pressure when you are not at a hospital or clinic, you can use:  An automated blood pressure machine at a pharmacy.  A home blood pressure monitor.  If you are between 45 years and 39 years old, ask your health care provider if you should take aspirin to prevent strokes.  Have regular diabetes screenings. This involves taking a blood sample to check your fasting blood sugar level.  If you are at a normal weight and have a low risk for diabetes,  have this test once every three years after 44 years of age.  If you are overweight and have a high risk for diabetes, consider being tested at a younger age or more often. PREVENTING INFECTION  Hepatitis B  If you have a higher risk for hepatitis B, you should be screened for this virus. You are considered at high risk for hepatitis B if:  You were born in a country where hepatitis B is common. Ask your health care provider which countries are considered high risk.  Your parents were born in a high-risk country, and you have not been immunized against hepatitis B (hepatitis B vaccine).  You have HIV or AIDS.  You use needles to inject street drugs.  You live with someone who has hepatitis B.  You have had sex with someone who has hepatitis B.  You get hemodialysis treatment.  You take certain medicines for conditions, including cancer, organ transplantation, and autoimmune conditions. Hepatitis C  Blood testing is recommended for:  Everyone born from [birth year] through 1964.  Anyone with known risk factors for hepatitis C. Sexually transmitted infections (STIs)  You should be screened for sexually transmitted infections (STIs) including gonorrhea and chlamydia if:  You are sexually active and are younger than 44 years of age.  You are older than 44 years of age and your health care provider tells you that you are at risk for this type of infection.  Your sexual activity has changed since you were last screened and you are at an increased risk for chlamydia or gonorrhea. Ask your health care provider if you are at risk.  If you do not have HIV, but are at risk, it may be recommended that you take a prescription medicine daily to prevent HIV infection. This is called pre-exposure prophylaxis (PrEP). You are considered at risk if:  You are sexually active and do not regularly use condoms or know the HIV status of your partner(s).  You take drugs by injection.  You are sexually  active with a partner who has HIV. Talk with your health care provider about whether you are at high risk of being infected with HIV. If you choose to begin PrEP, you should first be tested for HIV. You should then be tested every 3 months for as long as you are taking PrEP.  PREGNANCY   If you are premenopausal and you may become pregnant, ask your health care provider about preconception counseling.  If you may  become pregnant, take 400 to 800 micrograms (mcg) of folic acid every day.  If you want to prevent pregnancy, talk to your health care provider about birth control (contraception). OSTEOPOROSIS AND MENOPAUSE   Osteoporosis is a disease in which the bones lose minerals and strength with aging. This can result in serious bone fractures. Your risk for osteoporosis can be identified using a bone density scan.  If you are 61 years of age or older, or if you are at risk for osteoporosis and fractures, ask your health care provider if you should be screened.  Ask your health care provider whether you should take a calcium or vitamin D supplement to lower your risk for osteoporosis.  Menopause may have certain physical symptoms and risks.  Hormone replacement therapy may reduce some of these symptoms and risks. Talk to your health care provider about whether hormone replacement therapy is right for you.  HOME CARE INSTRUCTIONS   Schedule regular health, dental, and eye exams.  Stay current with your immunizations.   Do not use any tobacco products including cigarettes, chewing tobacco, or electronic cigarettes.  If you are pregnant, do not drink alcohol.  If you are breastfeeding, limit how much and how often you drink alcohol.  Limit alcohol intake to no more than 1 drink per day for nonpregnant women. One drink equals 12 ounces of beer, 5 ounces of wine, or 1 ounces of hard liquor.  Do not use street drugs.  Do not share needles.  Ask your health care provider for help if  you need support or information about quitting drugs.  Tell your health care provider if you often feel depressed.  Tell your health care provider if you have ever been abused or do not feel safe at home.   This information is not intended to replace advice given to you by your health care provider. Make sure you discuss any questions you have with your health care provider.   Document Released: 09/25/2010 Document Revised: 04/02/2014 Document Reviewed: 02/11/2013 Elsevier Interactive Patient Education [employer].

## 2015-10-14 NOTE — Assessment & Plan Note (Signed)
Recheck vitamin D levels today. Consider daily 2000IU repletion.

## 2015-10-14 NOTE — Assessment & Plan Note (Signed)
PRN meclizine for dizziness. Consider vestibular rehab.

## 2015-10-14 NOTE — Assessment & Plan Note (Signed)
Check CBC to r/o anemia. Trial of ibuprofen as needed for cramping and discomfort. Pt will discuss OCP's at her GYN appt.

## 2015-10-14 NOTE — Assessment & Plan Note (Signed)
Renewed PRN acyclovir.

## 2015-10-15 LAB — COMPLETE METABOLIC PANEL WITH GFR
ALBUMIN: 4.1 g/dL (ref 3.6–5.1)
ALK PHOS: 47 U/L (ref 33–115)
ALT: 10 U/L (ref 6–29)
AST: 15 U/L (ref 10–30)
BILIRUBIN TOTAL: 0.4 mg/dL (ref 0.2–1.2)
BUN: 12 mg/dL (ref 7–25)
CO2: 25 mmol/L (ref 20–31)
Calcium: 9.3 mg/dL (ref 8.6–10.2)
Chloride: 102 mmol/L (ref 98–110)
Creat: 0.86 mg/dL (ref 0.50–1.10)
GFR, EST NON AFRICAN AMERICAN: 82 mL/min (ref >=60)
Glucose, Bld: 81 mg/dL (ref 65–99)
POTASSIUM: 4.4 mmol/L (ref 3.5–5.3)
SODIUM: 138 mmol/L (ref 135–146)
TOTAL PROTEIN: 7.5 g/dL (ref 6.1–8.1)

## 2015-10-15 LAB — LIPID PANEL
CHOL/HDL RATIO: 3.3 ratio (ref <=5.0)
Cholesterol: 206 mg/dL — ABNORMAL HIGH (ref 125–200)
HDL: 63 mg/dL (ref >=46)
LDL Cholesterol: 130 mg/dL — ABNORMAL HIGH (ref <130)
TRIGLYCERIDES: 65 mg/dL (ref <150)
VLDL: 13 mg/dL (ref <30)

## 2015-10-15 LAB — VITAMIN D 25 HYDROXY (VIT D DEFICIENCY, FRACTURES): Vit D, 25-Hydroxy: 26 ng/mL — ABNORMAL LOW (ref 30–100)

## 2016-03-14 IMAGING — CR DG CHEST 1V PORT
1 series · 1 of 1 positions shown · non-contrast
Comparison: None.

CLINICAL DATA: Bradycardia. Low blood pressure. Dizziness for 2
days.

EXAM:
PORTABLE CHEST - 1 VIEW

[ap]
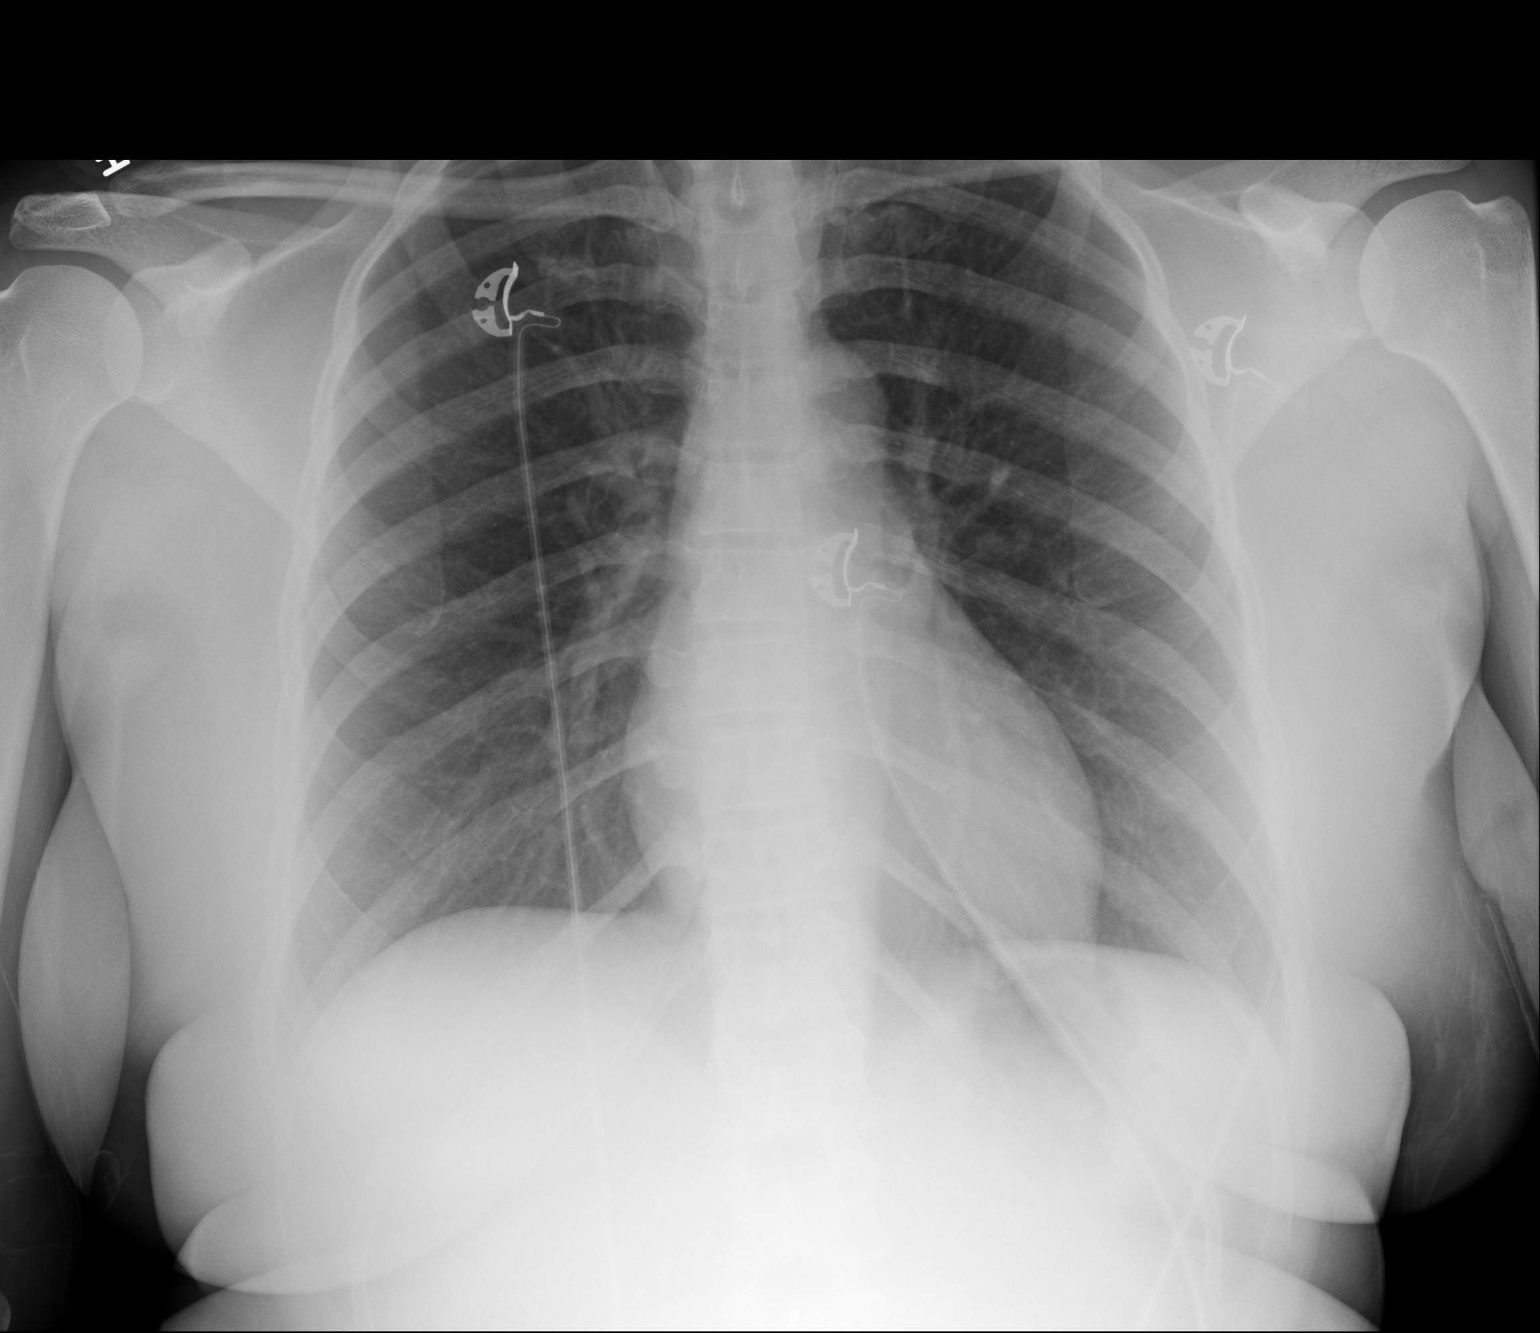

[1 of 1 positions shown; findings below may reference images not displayed]

FINDINGS: Midline trachea.  Normal heart size and mediastinal contours.

Sharp costophrenic angles.  No pneumothorax.  Clear lungs.
IMPRESSION: No active disease.

## 2016-03-23 ENCOUNTER — Telehealth: Payer: Self-pay | Admitting: Family Medicine

## 2016-03-23 DIAGNOSIS — N920 Excessive and frequent menstruation with regular cycle: Secondary | ICD-10-CM

## 2016-03-23 MED ORDER — IBUPROFEN 600 MG PO TABS: 600.0000 mg | ORAL_TABLET | Freq: Three times a day (TID) | ORAL | 2 refills | Status: DC | PRN

## 2016-03-23 NOTE — Telephone Encounter (Signed)
Pt asked for a refill on ibuprofen 600 mg for menstrual cramps.  She uses [facility] in [place].  Her call back number is [phone]

## 2016-03-23 NOTE — Telephone Encounter (Signed)
Refill sent to pharmacy.   

## 2017-05-14 ENCOUNTER — Telehealth: Payer: Self-pay

## 2017-05-14 NOTE — Telephone Encounter (Signed)
LMVM to notify light menses ok. If heavy advised to R/S as test results may not be able to be processed during heavy menses & would have to be repeated.

## 2017-05-14 NOTE — Telephone Encounter (Signed)
Pt has AE apt 05/16/17 & is on her menses but it is light. Inquiring if she should keep apt or r/s. [phone]

## 2017-05-16 ENCOUNTER — Ambulatory Visit: Payer: Managed Care, Other (non HMO) | Admitting: Obstetrics and Gynecology

## 2017-08-15 ENCOUNTER — Ambulatory Visit (INDEPENDENT_AMBULATORY_CARE_PROVIDER_SITE_OTHER): Payer: BLUE CROSS/BLUE SHIELD | Admitting: Obstetrics and Gynecology

## 2017-08-15 ENCOUNTER — Encounter: Payer: Self-pay | Admitting: Obstetrics and Gynecology

## 2017-08-15 VITALS — BP 120/80 | HR 71 | Ht 66.0 in | Wt 154.0 lb

## 2017-08-15 DIAGNOSIS — N946 Dysmenorrhea, unspecified: Secondary | ICD-10-CM | POA: Diagnosis not present

## 2017-08-15 DIAGNOSIS — Z1239 Encounter for other screening for malignant neoplasm of breast: Secondary | ICD-10-CM

## 2017-08-15 DIAGNOSIS — Z124 Encounter for screening for malignant neoplasm of cervix: Secondary | ICD-10-CM | POA: Diagnosis not present

## 2017-08-15 DIAGNOSIS — Z01419 Encounter for gynecological examination (general) (routine) without abnormal findings: Secondary | ICD-10-CM

## 2017-08-15 DIAGNOSIS — Z1231 Encounter for screening mammogram for malignant neoplasm of breast: Secondary | ICD-10-CM

## 2017-08-15 DIAGNOSIS — Z0000 Encounter for general adult medical examination without abnormal findings: Secondary | ICD-10-CM | POA: Diagnosis not present

## 2017-08-15 DIAGNOSIS — Z1151 Encounter for screening for human papillomavirus (HPV): Secondary | ICD-10-CM

## 2017-08-15 DIAGNOSIS — Z803 Family history of malignant neoplasm of breast: Secondary | ICD-10-CM

## 2017-08-15 DIAGNOSIS — A6004 Herpesviral vulvovaginitis: Secondary | ICD-10-CM | POA: Diagnosis not present

## 2017-08-15 MED ORDER — ACYCLOVIR 400 MG PO TABS: 400.0000 mg | ORAL_TABLET | Freq: Two times a day (BID) | ORAL | 11 refills | Status: DC

## 2017-08-15 MED ORDER — IBUPROFEN 600 MG PO TABS: 600.0000 mg | ORAL_TABLET | Freq: Three times a day (TID) | ORAL | 2 refills | Status: DC | PRN

## 2017-08-15 NOTE — Progress Notes (Signed)
PCP:  Patient, No Pcp Per   Chief Complaint  Patient presents with  . Gynecologic Exam     HPI:      Christine Hoffman is a 47 y.o. G1P1 who LMP was Patient's last menstrual period was 07/21/2017., presents today for her annual examination.  Her menses are regular every 28-30 days, lasting 2-3 days, very light  Dysmenorrhea mild to moderate, occurring first 1-2 days of flow. Takes Rx ibup with some relief. She does not have intermenstrual bleeding. S/p endometrial ablation.  Sex activity: single partner, contraception - tubal ligation.  Last Pap: November 22, 2015  Results were: no abnormalities /neg HPV DNA done 09/08/13 Hx of STDs: HSV, takes acyclovir occas daily, needs RF  Last mammogram: September 08, 2013  Results were: normal--routine follow-up in 12 months There is a FH of breast cancer in her mother and pat aunt. There is no FH of ovarian cancer in her niece. Genetic testing not indicated for pt. The patient does do self-breast exams.  Tobacco use: The patient denies current or previous tobacco use. Alcohol use: none No drug use.  Exercise: not active  She does not get adequate calcium and Vitamin D in her diet. Labs with PCP. Hx of vertigo.   Past Medical History:  Diagnosis Date  . Gastritis 2004  . GERD (gastroesophageal reflux disease)   . Herpes   . Hypertension 2012  . Lump or mass in breast   . Vertigo     Past Surgical History:  Procedure Laterality Date  . ABLATION  2010  . 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  . TUBAL LIGATION  2010    Family History  Problem Relation Age of Onset  . Parkinson's disease Father   . Breast cancer Mother 16  . Hypertension Mother   . Ovarian cancer Unknown 41  . Breast cancer Paternal Aunt 45    Social History   Socioeconomic History  . Marital status: Single    Spouse name: Not on file  . Number of children: Not on file  . Years of  education: Not on file  . Highest education level: Not on file  Occupational History  . Not on file  Social Needs  . Financial resource strain: Not on file  . Food insecurity:    Worry: Not on file    Inability: Not on file  . Transportation needs:    Medical: Not on file    Non-medical: Not on file  Tobacco Use  . Smoking status: Never Smoker  . Smokeless tobacco: Never Used  Substance and Sexual Activity  . Alcohol use: Yes  . Drug use: No  . Sexual activity: Yes    Comment: Tubal ligation  Lifestyle  . Physical activity:    Days per week: Not on file    Minutes per session: Not on file  . Stress: Not on file  Relationships  . Social connections:    Talks on phone: Not on file    Gets together: Not on file    Attends religious service: Not on file    Active member of club or organization: Not on file    Attends meetings of clubs or organizations: Not on file    Relationship status: Not on file  . Intimate partner violence:    Fear of current or ex partner: Not on file    Emotionally abused: Not on file    Physically abused:  Not on file    Forced sexual activity: Not on file  Other Topics Concern  . Not on file  Social History Narrative  . Not on file    Outpatient Medications Prior to Visit  Medication Sig Dispense Refill  . acyclovir (ZOVIRAX) 400 MG tablet Take 1 tablet (400 mg total) by mouth 5 (five) times daily. As needed 30 tablet 11  . ergocalciferol (VITAMIN D2) 50000 units capsule Take 1 capsule (50,000 Units total) by mouth once a week. (Patient not taking: Reported on 08/15/2017) 12 capsule 0  . fluticasone (FLONASE) 50 MCG/ACT nasal spray Place 2 sprays into both nostrils daily. (Patient not taking: Reported on 08/15/2017) 16 g 11  . meclizine (ANTIVERT) 32 MG tablet Take 1 tablet (32 mg total) by mouth 3 (three) times daily as needed. (Patient not taking: Reported on 08/15/2017) 30 tablet 11  . ondansetron (ZOFRAN) 4 MG tablet Take 1 tablet (4 mg total)  by mouth every 8 (eight) hours as needed for nausea or vomiting. (Patient not taking: Reported on 08/15/2017) 20 tablet 0  . pseudoephedrine (SUDAFED) 30 MG tablet Take 1 tablet (30 mg total) by mouth every 4 (four) hours as needed for congestion. (Patient not taking: Reported on 08/15/2017) 30 tablet 0  . ibuprofen (ADVIL,MOTRIN) 600 MG tablet Take 1 tablet (600 mg total) by mouth every 8 (eight) hours as needed. (Patient not taking: Reported on 08/15/2017) 30 tablet 2   No facility-administered medications prior to visit.       ROS:  Review of Systems  Constitutional: Positive for fatigue. Negative for fever and unexpected weight change.  Respiratory: Negative for cough, shortness of breath and wheezing.   Cardiovascular: Negative for chest pain, palpitations and leg swelling.  Gastrointestinal: Negative for blood in stool, constipation, diarrhea, nausea and vomiting.  Endocrine: Negative for cold intolerance, heat intolerance and polyuria.  Genitourinary: Negative for dyspareunia, dysuria, flank pain, frequency, genital sores, hematuria, menstrual problem, pelvic pain, urgency, vaginal bleeding, vaginal discharge and vaginal pain.  Musculoskeletal: Negative for back pain, joint swelling and myalgias.  Skin: Negative for rash.  Neurological: Positive for dizziness. Negative for syncope, light-headedness, numbness and headaches.  Hematological: Negative for adenopathy.  Psychiatric/Behavioral: Negative for agitation, confusion, sleep disturbance and suicidal ideas. The patient is not nervous/anxious.    BREAST: No symptoms   Objective: BP 120/80   Pulse 71   Ht 5\' 6"  (1.676 m)   Wt 154 lb (69.9 kg)   LMP 07/21/2017   BMI 24.86 kg/m    Physical Exam  Constitutional: She is oriented to person, place, and time. She appears well-developed and well-nourished.  Genitourinary: Vagina normal and uterus normal. There is no rash or tenderness on the right labia. There is no rash or  tenderness on the left labia. No erythema or tenderness in the vagina. No vaginal discharge found. Right adnexum does not display mass and does not display tenderness. Left adnexum does not display mass and does not display tenderness. Cervix does not exhibit motion tenderness or polyp. Uterus is not enlarged or tender.  Neck: Normal range of motion. No thyromegaly present.  Cardiovascular: Normal rate, regular rhythm and normal heart sounds.  No murmur heard. Pulmonary/Chest: Effort normal and breath sounds normal. Right breast exhibits no mass, no nipple discharge, no skin change and no tenderness. Left breast exhibits no mass, no nipple discharge, no skin change and no tenderness.  Abdominal: Soft. There is no tenderness. There is no guarding.  Musculoskeletal: Normal range of motion.  Neurological: She is alert and oriented to person, place, and time. No cranial nerve deficit.  Psychiatric: She has a normal mood and affect. Her behavior is normal.  Vitals reviewed.   Assessment/Plan: Encounter for annual routine gynecological examination  Cervical cancer screening - Plan: IGP, Aptima HPV  Screening for HPV (human papillomavirus) - Plan: IGP, Aptima HPV  Screening for breast cancer - Pt to sched mammo.  - Plan: MM 3D SCREEN BREAST BILATERAL  Family history of breast cancer - Doesn't qualify for cancer genetic testing. Cont monthly SBE, yearly mammos.  - Plan: MM 3D SCREEN BREAST BILATERAL  Herpes simplex vulvovaginitis - Pt takes acyclovir daily when she thinks about it. Rx RF.  - Plan: acyclovir (ZOVIRAX) 400 MG tablet  Dysmenorrhea - Rx RF ibup. Try thermacare heating pads. - Plan: ibuprofen (ADVIL,MOTRIN) 600 MG tablet  Meds ordered this encounter  Medications  . acyclovir (ZOVIRAX) 400 MG tablet    Sig: Take 1 tablet (400 mg total) by mouth 2 (two) times daily. As needed    Dispense:  60 tablet    Refill:  11    Order Specific Question:   Supervising Provider    Answer:    Gae Dry [id number]  . ibuprofen (ADVIL,MOTRIN) 600 MG tablet    Sig: Take 1 tablet (600 mg total) by mouth every 8 (eight) hours as needed.    Dispense:  30 tablet    Refill:  2    Order Specific Question:   Supervising Provider    Answer:   Gae Dry [[id number]]             GYN counsel breast self exam, mammography screening, menopause, adequate intake of calcium and vitamin D, diet and exercise     F/U  Return in about 1 year (around 08/16/2018).  Keryl Gholson B. Charlize Hathaway, PA-C 08/15/2017 4:35 PM

## 2017-08-15 NOTE — Patient Instructions (Signed)
I value your feedback and entrusting us with your care. If you get a [facility] patient survey, I would appreciate you taking the time to let us know about your experience today. Thank you! 

## 2017-08-22 LAB — IGP, APTIMA HPV
HPV APTIMA: NEGATIVE
PAP SMEAR COMMENT: 0

## 2017-08-29 ENCOUNTER — Other Ambulatory Visit: Payer: Self-pay | Admitting: Obstetrics and Gynecology

## 2017-08-29 DIAGNOSIS — N6320 Unspecified lump in the left breast, unspecified quadrant: Secondary | ICD-10-CM

## 2017-10-09 ENCOUNTER — Ambulatory Visit: Payer: Self-pay

## 2017-10-09 ENCOUNTER — Ambulatory Visit
Admission: RE | Admit: 2017-10-09 | Discharge: 2017-10-09 | Disposition: A | Discharge status: Home / Self Care | Payer: BLUE CROSS/BLUE SHIELD | Source: Ambulatory Visit | Attending: Obstetrics and Gynecology | Admitting: Obstetrics and Gynecology

## 2017-10-09 ENCOUNTER — Encounter: Payer: Self-pay | Admitting: Obstetrics and Gynecology

## 2017-10-09 ENCOUNTER — Other Ambulatory Visit: Payer: Self-pay | Admitting: Obstetrics and Gynecology

## 2017-10-09 DIAGNOSIS — N6320 Unspecified lump in the left breast, unspecified quadrant: Secondary | ICD-10-CM

## 2018-05-27 DIAGNOSIS — R079 Chest pain, unspecified: Secondary | ICD-10-CM | POA: Diagnosis not present

## 2018-06-19 ENCOUNTER — Other Ambulatory Visit: Payer: Self-pay | Admitting: Obstetrics and Gynecology

## 2018-06-19 ENCOUNTER — Telehealth: Payer: Self-pay

## 2018-06-19 DIAGNOSIS — N946 Dysmenorrhea, unspecified: Secondary | ICD-10-CM

## 2018-06-19 MED ORDER — IBUPROFEN 600 MG PO TABS: 600.0000 mg | ORAL_TABLET | Freq: Three times a day (TID) | ORAL | 0 refills | Status: DC | PRN

## 2018-06-19 NOTE — Progress Notes (Signed)
Rx RF ibup for dysmen until annual

## 2018-06-19 NOTE — Telephone Encounter (Signed)
Pt calling triage line requesting a refill on her Ibuprofen 600 mg for her periods.

## 2018-06-19 NOTE — Telephone Encounter (Signed)
Rx eRxd.  

## 2018-08-13 DIAGNOSIS — R079 Chest pain, unspecified: Secondary | ICD-10-CM | POA: Diagnosis not present

## 2018-08-15 DIAGNOSIS — R079 Chest pain, unspecified: Secondary | ICD-10-CM | POA: Diagnosis not present

## 2018-10-09 ENCOUNTER — Telehealth: Payer: BLUE CROSS/BLUE SHIELD

## 2018-10-11 ENCOUNTER — Telehealth: Payer: Self-pay | Admitting: Physician Assistant

## 2018-10-11 DIAGNOSIS — J069 Acute upper respiratory infection, unspecified: Secondary | ICD-10-CM

## 2018-10-11 MED ORDER — FLUTICASONE PROPIONATE 50 MCG/ACT NA SUSP: 2.0000 | Freq: Every day | NASAL | 0 refills | Status: DC

## 2018-10-11 NOTE — Progress Notes (Signed)
We are sorry you are not feeling well.  Here is how we plan to help!  Based on what you have shared with me, it looks like you may have a viral upper respiratory infection.  Upper respiratory infections are caused by a large number of viruses; however, rhinovirus is the most common cause.   Symptoms vary from person to person, with common symptoms including sore throat, cough, and fatigue or lack of energy.  A low-grade fever of up to 100.4 may present, but is often uncommon.  Symptoms vary however, and are closely related to a person's age or underlying illnesses.  The most common symptoms associated with an upper respiratory infection are nasal discharge or congestion, cough, sneezing, headache and pressure in the ears and face.  These symptoms usually persist for about 3 to 10 days, but can last up to 2 weeks.  It is important to know that upper respiratory infections do not cause serious illness or complications in most cases.    Upper respiratory infections can be transmitted from person to person, with the most common method of transmission being a person's hands.  The virus is able to live on the skin and can infect other persons for up to 2 hours after direct contact.  Also, these can be transmitted when someone coughs or sneezes; thus, it is important to cover the mouth to reduce this risk.  To keep the spread of the illness at [place], good hand hygiene is very important.  This is an infection that is most likely caused by a virus. There are no specific treatments other than to help you with the symptoms until the infection runs its course.  We are sorry you are not feeling well.  Here is how we plan to help!   For nasal congestion, you may use an oral decongestants such as Mucinex D or if you have glaucoma or high blood pressure use plain Mucinex.  Saline nasal spray or nasal drops can help and can safely be used as often as needed for congestion.  For your congestion, I have prescribed Fluticasone  nasal spray one spray in each nostril twice a day. This will help pull fluid off of the inner ear which contributes to balance issues.  If you do not have a history of heart disease, hypertension, diabetes or thyroid disease, prostate/bladder issues or glaucoma, you may also use Sudafed to treat nasal congestion.  It is highly recommended that you consult with a pharmacist or your primary care physician to ensure this medication is safe for you to take.     If you have a cough, you may use cough suppressants such as Delsym and Robitussin.  If you have glaucoma or high blood pressure, you can also use Coricidin HBP.   For cough I have prescribed for you A prescription cough medication called Tessalon Perles 100 mg. You may take 1-2 capsules every 8 hours as needed for cough  If you have a sore or scratchy throat, use a saltwater gargle-  to  teaspoon of salt dissolved in a 4-ounce to 8-ounce glass of warm water.  Gargle the solution for approximately 15-30 seconds and then spit.  It is important not to swallow the solution.  You can also use throat lozenges/cough drops and Chloraseptic spray to help with throat pain or discomfort.  Warm or cold liquids can also be helpful in relieving throat pain.  For headache, pain or general discomfort, you can use Ibuprofen or Tylenol as directed.  Some authorities believe that zinc sprays or the use of Echinacea may shorten the course of your symptoms.   HOME CARE . Only take medications as instructed by your medical team. . Be sure to drink plenty of fluids. Water is fine as well as fruit juices, sodas and electrolyte beverages. You may want to stay away from caffeine or alcohol. If you are nauseated, try taking small sips of liquids. How do you know if you are getting enough fluid? Your urine should be a pale yellow or almost colorless. . Get rest. . Taking a steamy shower or using a humidifier may help nasal congestion and ease sore throat pain. You can  place a towel over your head and breathe in the steam from hot water coming from a faucet. . Using a saline nasal spray works much the same way. . Cough drops, hard candies and sore throat lozenges may ease your cough. . Avoid close contacts especially the very young and the elderly . Cover your mouth if you cough or sneeze . Always remember to wash your hands.   GET HELP RIGHT AWAY IF: . You develop worsening fever. . If your symptoms do not improve within 10 days . You develop yellow or green discharge from your nose over 3 days. . You have coughing fits . You develop a severe head ache or visual changes. . You develop shortness of breath, difficulty breathing or start having chest pain . Your symptoms persist after you have completed your treatment plan  MAKE SURE YOU   Understand these instructions.  Will watch your condition.  Will get help right away if you are not doing well or get worse.  Your e-visit answers were reviewed by a board certified advanced clinical practitioner to complete your personal care plan. Depending upon the condition, your plan could have included both over the counter or prescription medications. Please review your pharmacy choice. If there is a problem, you may call our nursing hot line at and have the prescription routed to another pharmacy. Your safety is important to [place]. If you have drug allergies check your prescription carefully.   You can use MyChart to ask questions about today's visit, request a non-urgent call back, or ask for a work or school excuse for 24 hours related to this e-Visit. If it has been greater than 24 hours you will need to follow up with your provider, or enter a new e-Visit to address those concerns. You will get an e-mail in the next two days asking about your experience.  I hope that your e-visit has been valuable and will speed your recovery. Thank you for using e-visits.

## 2018-10-11 NOTE — Progress Notes (Signed)
I have spent 5 minutes in review of e-visit questionnaire, review and updating patient chart, medical decision making and response to patient.   Anoop Hemmer Cody Karthik Whittinghill, PA-C    

## 2018-10-12 ENCOUNTER — Telehealth: Payer: Self-pay | Admitting: Nurse Practitioner

## 2018-10-12 DIAGNOSIS — R110 Nausea: Secondary | ICD-10-CM

## 2018-10-12 MED ORDER — ONDANSETRON HCL 4 MG PO TABS
4.0000 mg | ORAL_TABLET | Freq: Three times a day (TID) | ORAL | 0 refills | Status: AC | PRN
Start: 2018-10-12 — End: ?

## 2018-10-12 NOTE — Addendum Note (Signed)
Addended by: Chevis Pretty on: 10/12/2018 05:51 PM   Modules accepted: Orders

## 2018-10-12 NOTE — Progress Notes (Signed)
We are sorry that you are not feeling well. Here is how we plan to help!  Based on what you have shared with me it looks like you have nausea from decongestant you are taking. I have prescribed a medication that will help alleviate your symptoms and allow you to stay hydrated:  Zofran 4 mg 1 tablet every 8 hours as needed for nausea and vomiting  HOME CARE:  Drink clear liquids.  This is very important! Dehydration (the lack of fluid) can lead to a serious complication.  Start off with 1 tablespoon every 5 minutes for 8 hours.  You may begin eating bland foods after 8 hours without vomiting.  Start with saltine crackers, white bread, rice, mashed potatoes, applesauce.  After 48 hours on a bland diet, you may resume a normal diet.  Try to go to sleep.  Sleep often empties the stomach and relieves the need to vomit.  GET HELP RIGHT AWAY IF:   Your symptoms do not improve or worsen within 2 days after treatment.  You have a fever for over 3 days.  You cannot keep down fluids after trying the medication.  MAKE SURE YOU:   Understand these instructions.  Will watch your condition.  Will get help right away if you are not doing well or get worse.   Thank you for choosing an e-visit. Your e-visit answers were reviewed by a board certified advanced clinical practitioner to complete your personal care plan. Depending upon the condition, your plan could have included both over the counter or prescription medications. Please review your pharmacy choice. Be sure that the pharmacy you have chosen is open so that you can pick up your prescription now.  If there is a problem you may message your provider in [place] to have the prescription routed to another pharmacy. Your safety is important to [place]. If you have drug allergies check your prescription carefully.  For the next 24 hours, you can use MyChart to ask questions about today's visit, request a non-urgent call back, or ask for a work or  school excuse from your e-visit provider. You will get an e-mail in the next two days asking about your experience. I hope that your e-visit has been valuable and will speed your recovery.   5-10 minutes spent reviewing and documenting in chart.'

## 2018-12-08 ENCOUNTER — Ambulatory Visit (INDEPENDENT_AMBULATORY_CARE_PROVIDER_SITE_OTHER): Payer: 59 | Admitting: Obstetrics and Gynecology

## 2018-12-08 ENCOUNTER — Encounter: Payer: Self-pay | Admitting: Obstetrics and Gynecology

## 2018-12-08 ENCOUNTER — Other Ambulatory Visit: Payer: Self-pay

## 2018-12-08 VITALS — BP 130/90 | Ht 64.0 in | Wt 161.0 lb

## 2018-12-08 DIAGNOSIS — Z01419 Encounter for gynecological examination (general) (routine) without abnormal findings: Secondary | ICD-10-CM | POA: Diagnosis not present

## 2018-12-08 DIAGNOSIS — Z1239 Encounter for other screening for malignant neoplasm of breast: Secondary | ICD-10-CM

## 2018-12-08 DIAGNOSIS — N951 Menopausal and female climacteric states: Secondary | ICD-10-CM

## 2018-12-08 DIAGNOSIS — Z803 Family history of malignant neoplasm of breast: Secondary | ICD-10-CM

## 2018-12-08 DIAGNOSIS — A6004 Herpesviral vulvovaginitis: Secondary | ICD-10-CM

## 2018-12-08 MED ORDER — ACYCLOVIR 400 MG PO TABS: 400.0000 mg | ORAL_TABLET | Freq: Two times a day (BID) | ORAL | 1 refills | Status: DC

## 2018-12-08 NOTE — Patient Instructions (Signed)
I value your feedback and entrusting us with your care. If you get a [place] patient survey, I would appreciate you taking the time to let us know about your experience today. Thank you!  [facility]: [phone]    

## 2018-12-08 NOTE — Progress Notes (Signed)
PCP:  Patient, No Pcp Per   Chief Complaint  Patient presents with  . Gynecologic Exam  . Hot Flashes    since July 2020     HPI:      Ms. Christine Hoffman is a 48 y.o. G1P1 who LMP was Patient's last menstrual period was 10/03/2018 (approximate)., presents today for her annual examination.  Her menses are absent since 7/20, had been regular every 28-30 days, lasting 2-3 days, very light  Dysmenorrhea mild to moderate, occurring first 1-2 days of flow. Takes Rx ibup with some relief. She does not have intermenstrual bleeding. S/p endometrial ablation. Also having hot flashes since 7/20.  Sex activity: single partner, contraception - tubal ligation.  Last Pap: 08/15/17 Results were: no abnormalities /neg HPV DNA  Hx of STDs: HSV, takes acyclovir occas daily, needs RF  Last mammogram: 10/09/17 at [facility] Results were: normal--routine follow-up in 12 months There is a FH of breast cancer in her mother and pat aunt. There is a FH of ovarian cancer in her sister's daughter/pt's niece and pancreatic cancer in her borther. Genetic testing not done in any family member.  The patient does not do self-breast exams.  Tobacco use: The patient denies current or previous tobacco use. Alcohol use: none No drug use.  Exercise: mod active  She does not get adequate calcium but does get Vitamin D in her diet. Labs with PCP. Hx of Vit D deficiency.  Past Medical History:  Diagnosis Date  . Gastritis 2004  . GERD (gastroesophageal reflux disease)   . Herpes   . Hypertension 2012  . Lump or mass in breast   . Vertigo     Past Surgical History:  Procedure Laterality Date  . ABLATION  2010  . 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  . TUBAL LIGATION  2010    Family History  Problem Relation Age of Onset  . Parkinson's disease Father   . Breast cancer Mother 34  . Hypertension Mother   . Ovarian cancer Other 22        sister's daughter  . Breast cancer Paternal Aunt 25  . Pancreatic cancer Brother 5    Social History   Socioeconomic History  . Marital status: Single    Spouse name: Not on file  . Number of children: Not on file  . Years of education: Not on file  . Highest education level: Not on file  Occupational History  . Not on file  Social Needs  . Financial resource strain: Not on file  . Food insecurity    Worry: Not on file    Inability: Not on file  . Transportation needs    Medical: Not on file    Non-medical: Not on file  Tobacco Use  . Smoking status: Never Smoker  . Smokeless tobacco: Never Used  Substance and Sexual Activity  . Alcohol use: Yes  . Drug use: No  . Sexual activity: Yes    Birth control/protection: Surgical    Comment: Tubal ligation  Lifestyle  . Physical activity    Days per week: Not on file    Minutes per session: Not on file  . Stress: Not on file  Relationships  . Social Herbalist on phone: Not on file    Gets together: Not on file    Attends religious service: Not on file    Active member of club or organization:  Not on file    Attends meetings of clubs or organizations: Not on file    Relationship status: Not on file  . Intimate partner violence    Fear of current or ex partner: Not on file    Emotionally abused: Not on file    Physically abused: Not on file    Forced sexual activity: Not on file  Other Topics Concern  . Not on file  Social History Narrative  . Not on file    Outpatient Medications Prior to Visit  Medication Sig Dispense Refill  . ALPRAZolam (XANAX) 0.25 MG tablet     . ergocalciferol (VITAMIN D2) 50000 units capsule Take 1 capsule (50,000 Units total) by mouth once a week. 12 capsule 0  . fluticasone (FLONASE) 50 MCG/ACT nasal spray Place 2 sprays into both nostrils daily. 16 g 0  . meclizine (ANTIVERT) 32 MG tablet Take 1 tablet (32 mg total) by mouth 3 (three) times daily as needed. 30 tablet 11   . ondansetron (ZOFRAN) 4 MG tablet Take 1 tablet (4 mg total) by mouth every 8 (eight) hours as needed for nausea or vomiting. 20 tablet 0  . acyclovir (ZOVIRAX) 400 MG tablet Take 1 tablet (400 mg total) by mouth 2 (two) times daily. As needed 60 tablet 11  . ibuprofen (ADVIL,MOTRIN) 600 MG tablet Take 1 tablet (600 mg total) by mouth every 8 (eight) hours as needed. 30 tablet 0  . pseudoephedrine (SUDAFED) 30 MG tablet Take 1 tablet (30 mg total) by mouth every 4 (four) hours as needed for congestion. (Patient not taking: Reported on 08/15/2017) 30 tablet 0   No facility-administered medications prior to visit.       ROS:  Review of Systems  Constitutional: Positive for fatigue. Negative for fever and unexpected weight change.  Respiratory: Negative for cough, shortness of breath and wheezing.   Cardiovascular: Negative for chest pain, palpitations and leg swelling.  Gastrointestinal: Negative for blood in stool, constipation, diarrhea, nausea and vomiting.  Endocrine: Negative for cold intolerance, heat intolerance and polyuria.  Genitourinary: Negative for dyspareunia, dysuria, flank pain, frequency, genital sores, hematuria, menstrual problem, pelvic pain, urgency, vaginal bleeding, vaginal discharge and vaginal pain.  Musculoskeletal: Negative for back pain, joint swelling and myalgias.  Skin: Negative for rash.  Neurological: Positive for dizziness and headaches. Negative for syncope, light-headedness and numbness.  Hematological: Negative for adenopathy.  Psychiatric/Behavioral: Positive for agitation. Negative for confusion, sleep disturbance and suicidal ideas. The patient is not nervous/anxious.   BREAST: No symptoms   Objective: BP 130/90   Ht 5\' 4"  (1.626 m)   Wt 161 lb (73 kg)   LMP 10/03/2018 (Approximate)   BMI 27.64 kg/m    Physical Exam Constitutional:      Appearance: She is well-developed.  Genitourinary:     Vagina and uterus normal.     No vaginal  discharge, erythema or tenderness.     No cervical motion tenderness or polyp.     Uterus is not enlarged or tender.     No right or left adnexal mass present.     Right adnexa not tender.     Left adnexa not tender.  Neck:     Musculoskeletal: Normal range of motion.     Thyroid: No thyromegaly.  Cardiovascular:     Rate and Rhythm: Normal rate and regular rhythm.     Heart sounds: Normal heart sounds. No murmur.  Pulmonary:     Effort: Pulmonary effort is normal.  Breath sounds: Normal breath sounds.  Chest:     Breasts:        Right: No mass, nipple discharge, skin change or tenderness.        Left: No mass, nipple discharge, skin change or tenderness.  Abdominal:     Palpations: Abdomen is soft.     Tenderness: There is no abdominal tenderness. There is no guarding.  Musculoskeletal: Normal range of motion.  Neurological:     Mental Status: She is alert and oriented to person, place, and time.     Cranial Nerves: No cranial nerve deficit.  Psychiatric:        Behavior: Behavior normal.  Vitals signs reviewed.     Assessment/Plan: Encounter for annual routine gynecological examination  Screening for breast cancer - Plan: MM 3D SCREEN BREAST BILATERAL--pt to sched mammo  Family history of breast cancer--MyRisk testing discussed and pt to consider. Handout given. Suggested affected brother do testing. If he can't, pt should do testing. She will f/u if desires.  Herpes simplex vulvovaginitis - Pt takes acyclovir daily when she thinks about it. Rx RF.  - Plan: acyclovir (ZOVIRAX) 400 MG tablet  Perimenopausal vasomotor symptoms--Tolerable. Could be stress related. F/u prn  Meds ordered this encounter  Medications  . acyclovir (ZOVIRAX) 400 MG tablet    Sig: Take 1 tablet (400 mg total) by mouth 2 (two) times daily. As needed    Dispense:  60 tablet    Refill:  1    Order Specific Question:   Supervising Provider    Answer:   Gae Dry [id number]              GYN counsel breast self exam, mammography screening, menopause, adequate intake of calcium and vitamin D, diet and exercise     F/U  Return in about 1 year (around 12/08/2019).  Alaysiah Browder B. Shatonya Passon, PA-C 12/08/2018 5:02 PM

## 2019-01-06 DIAGNOSIS — F419 Anxiety disorder, unspecified: Secondary | ICD-10-CM | POA: Diagnosis not present

## 2019-01-06 DIAGNOSIS — Z13220 Encounter for screening for lipoid disorders: Secondary | ICD-10-CM | POA: Diagnosis not present

## 2019-01-06 DIAGNOSIS — Z131 Encounter for screening for diabetes mellitus: Secondary | ICD-10-CM | POA: Diagnosis not present

## 2019-01-06 DIAGNOSIS — Z23 Encounter for immunization: Secondary | ICD-10-CM | POA: Diagnosis not present

## 2019-01-06 DIAGNOSIS — Z0000 Encounter for general adult medical examination without abnormal findings: Secondary | ICD-10-CM | POA: Diagnosis not present

## 2019-01-06 DIAGNOSIS — M545 Low back pain: Secondary | ICD-10-CM | POA: Diagnosis not present

## 2019-02-06 ENCOUNTER — Ambulatory Visit
Admission: RE | Admit: 2019-02-06 | Discharge: 2019-02-06 | Disposition: A | Discharge status: Home / Self Care | Payer: 59 | Source: Ambulatory Visit | Attending: Family Medicine | Admitting: Family Medicine

## 2019-02-06 ENCOUNTER — Other Ambulatory Visit: Payer: Self-pay | Admitting: Family Medicine

## 2019-02-06 DIAGNOSIS — M545 Low back pain, unspecified: Secondary | ICD-10-CM

## 2019-02-06 DIAGNOSIS — M5136 Other intervertebral disc degeneration, lumbar region: Secondary | ICD-10-CM | POA: Diagnosis not present

## 2019-02-18 ENCOUNTER — Telehealth: Payer: Self-pay

## 2019-02-18 NOTE — Telephone Encounter (Signed)
Pt calling; has pain in crease of stomach on the left side; is on her period; pain started yesterday; took ibuprofen but it didn't last long.  [phone]  Pt states she only took half of the ibuprofen she had.  Later she took the other half and is feeling better now.  Adv that may be the ovary she ovulated from.  Adv [facility] is out of town until next week.  Adv may take 600mg  ibuprofen q6h or 800mg  q8h and apply heat 66m qhr - don't sleep with it.  If continues or worsens to be seen.

## 2019-03-20 ENCOUNTER — Other Ambulatory Visit: Payer: Self-pay | Admitting: Obstetrics and Gynecology

## 2019-03-20 DIAGNOSIS — N946 Dysmenorrhea, unspecified: Secondary | ICD-10-CM

## 2019-07-02 ENCOUNTER — Other Ambulatory Visit: Payer: Self-pay | Admitting: Obstetrics and Gynecology

## 2019-07-02 DIAGNOSIS — Z1231 Encounter for screening mammogram for malignant neoplasm of breast: Secondary | ICD-10-CM

## 2019-07-08 ENCOUNTER — Ambulatory Visit
Admission: RE | Admit: 2019-07-08 | Discharge: 2019-07-08 | Disposition: A | Discharge status: Home / Self Care | Payer: 59 | Source: Ambulatory Visit | Attending: Obstetrics and Gynecology | Admitting: Obstetrics and Gynecology

## 2019-07-08 ENCOUNTER — Other Ambulatory Visit: Payer: Self-pay

## 2019-07-08 DIAGNOSIS — Z1231 Encounter for screening mammogram for malignant neoplasm of breast: Secondary | ICD-10-CM | POA: Diagnosis not present

## 2019-07-09 ENCOUNTER — Encounter: Payer: Self-pay | Admitting: Obstetrics and Gynecology

## 2019-12-14 ENCOUNTER — Ambulatory Visit (INDEPENDENT_AMBULATORY_CARE_PROVIDER_SITE_OTHER): Payer: 59 | Admitting: Obstetrics and Gynecology

## 2019-12-14 ENCOUNTER — Encounter: Payer: Self-pay | Admitting: Obstetrics and Gynecology

## 2019-12-14 ENCOUNTER — Other Ambulatory Visit: Payer: Self-pay

## 2019-12-14 VITALS — BP 110/80 | Ht 66.0 in | Wt 159.0 lb

## 2019-12-14 DIAGNOSIS — A6004 Herpesviral vulvovaginitis: Secondary | ICD-10-CM | POA: Diagnosis not present

## 2019-12-14 DIAGNOSIS — Z1211 Encounter for screening for malignant neoplasm of colon: Secondary | ICD-10-CM | POA: Diagnosis not present

## 2019-12-14 DIAGNOSIS — Z803 Family history of malignant neoplasm of breast: Secondary | ICD-10-CM | POA: Insufficient documentation

## 2019-12-14 DIAGNOSIS — Z01419 Encounter for gynecological examination (general) (routine) without abnormal findings: Secondary | ICD-10-CM

## 2019-12-14 DIAGNOSIS — Z1231 Encounter for screening mammogram for malignant neoplasm of breast: Secondary | ICD-10-CM

## 2019-12-14 DIAGNOSIS — Z800 Family history of malignant neoplasm of digestive organs: Secondary | ICD-10-CM | POA: Insufficient documentation

## 2019-12-14 MED ORDER — ACYCLOVIR 400 MG PO TABS: 400.0000 mg | ORAL_TABLET | Freq: Two times a day (BID) | ORAL | 1 refills | Status: DC

## 2019-12-14 NOTE — Patient Instructions (Signed)
I value your feedback and entrusting us with your care. If you get a [place] patient survey, I would appreciate you taking the time to let us know about your experience today. Thank you! ° °As of March 05, 2019, your lab results will be released to your MyChart immediately, before I even have a chance to see them. Please give me time to review them and contact you if there are any abnormalities. Thank you for your patience.  ° °[facility]: [phone] ° ° ° °

## 2019-12-14 NOTE — Progress Notes (Signed)
PCP:  Patient, No Pcp Per   Chief Complaint  Patient presents with  . Gynecologic Exam     HPI:      Ms. Christine Hoffman is a 49 y.o. G1P1 who LMP was Patient's last menstrual period was 10/03/2018 (approximate)., presents today for her annual examination.  Her menses are absent since 7/20, had been regular every 28-30 days, lasting 2-3 days, very light. S/p endometrial ablation. Also having hot flashes since 7/20.  Sex activity: single partner, contraception - tubal ligation.  Last Pap: 08/15/17 Results were: no abnormalities /neg HPV DNA  Hx of STDs: HSV, takes acyclovir prn sx, but no recent outbreaks; needs RF  Last mammogram: 07/08/19  Results were: normal--routine follow-up in 12 months There is a FH of breast cancer in her mother and pat aunt. There is a FH of ovarian cancer in her sister's daughter/pt's niece and prostate (not pancreatic) cancer in her borther. Genetic testing not done in any family member and pt declined last yr.  The patient does not do self-breast exams.  Tobacco use: The patient denies current or previous tobacco use. Alcohol use: none No drug use.  Exercise: mod active  Colonoscopy: never  She does not get adequate calcium but does get Vitamin D in her diet. Labs with PCP. Hx of Vit D deficiency. Recently diagnosed with benign positional vertigo.  Past Medical History:  Diagnosis Date  . Gastritis 2004  . GERD (gastroesophageal reflux disease)   . Herpes   . Hypertension 2012  . Lump or mass in breast   . Vertigo     Past Surgical History:  Procedure Laterality Date  . ABLATION  2010  . 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  . TUBAL LIGATION  2010    Family History  Problem Relation Age of Onset  . Parkinson's disease Father   . Breast cancer Mother 51  . Hypertension Mother   . Ovarian cancer Other 59       sister's daughter  . Breast cancer Paternal Aunt 73   . Prostate cancer Brother 41    Social History   Socioeconomic History  . Marital status: Single    Spouse name: Not on file  . Number of children: Not on file  . Years of education: Not on file  . Highest education level: Not on file  Occupational History  . Not on file  Tobacco Use  . Smoking status: Never Smoker  . Smokeless tobacco: Never Used  Vaping Use  . Vaping Use: Never used  Substance and Sexual Activity  . Alcohol use: Yes  . Drug use: No  . Sexual activity: Yes    Birth control/protection: Surgical    Comment: Tubal ligation  Other Topics Concern  . Not on file  Social History Narrative  . Not on file   Social Determinants of Health   Financial Resource Strain:   . Difficulty of Paying Living Expenses: Not on file  Food Insecurity:   . Worried About Charity fundraiser in the Last Year: Not on file  . Ran Out of Food in the Last Year: Not on file  Transportation Needs:   . Lack of Transportation (Medical): Not on file  . Lack of Transportation (Non-Medical): Not on file  Physical Activity:   . Days of Exercise per Week: Not on file  . Minutes of Exercise per Session: Not on file  Stress:   . Feeling  of Stress : Not on file  Social Connections:   . Frequency of Communication with Friends and Family: Not on file  . Frequency of Social Gatherings with Friends and Family: Not on file  . Attends Religious Services: Not on file  . Active Member of Clubs or Organizations: Not on file  . Attends Archivist Meetings: Not on file  . Marital Status: Not on file  Intimate Partner Violence:   . Fear of Current or Ex-Partner: Not on file  . Emotionally Abused: Not on file  . Physically Abused: Not on file  . Sexually Abused: Not on file    Outpatient Medications Prior to Visit  Medication Sig Dispense Refill  . ALPRAZolam (XANAX) 0.25 MG tablet     . meclizine (ANTIVERT) 32 MG tablet Take 1 tablet (32 mg total) by mouth 3 (three) times daily as  needed. 30 tablet 11  . ondansetron (ZOFRAN) 4 MG tablet Take 1 tablet (4 mg total) by mouth every 8 (eight) hours as needed for nausea or vomiting. 20 tablet 0  . acyclovir (ZOVIRAX) 400 MG tablet Take 1 tablet (400 mg total) by mouth 2 (two) times daily. As needed 60 tablet 1  . ergocalciferol (VITAMIN D2) 50000 units capsule Take 1 capsule (50,000 Units total) by mouth once a week. (Patient not taking: Reported on 12/14/2019) 12 capsule 0  . fluticasone (FLONASE) 50 MCG/ACT nasal spray Place 2 sprays into both nostrils daily. 16 g 0  . ibuprofen (ADVIL) 600 MG tablet TAKE 1 TABLET(600 MG) BY MOUTH EVERY 8 HOURS AS NEEDED 30 tablet 1   No facility-administered medications prior to visit.      ROS:  Review of Systems  Constitutional: Negative for fatigue, fever and unexpected weight change.  Respiratory: Negative for cough, shortness of breath and wheezing.   Cardiovascular: Negative for chest pain, palpitations and leg swelling.  Gastrointestinal: Negative for blood in stool, constipation, diarrhea, nausea and vomiting.  Endocrine: Negative for cold intolerance, heat intolerance and polyuria.  Genitourinary: Negative for dyspareunia, dysuria, flank pain, frequency, genital sores, hematuria, menstrual problem, pelvic pain, urgency, vaginal bleeding, vaginal discharge and vaginal pain.  Musculoskeletal: Negative for back pain, joint swelling and myalgias.  Skin: Negative for rash.  Neurological: Positive for dizziness and headaches. Negative for syncope, light-headedness and numbness.  Hematological: Negative for adenopathy.  Psychiatric/Behavioral: Negative for agitation, confusion, sleep disturbance and suicidal ideas. The patient is not nervous/anxious.   BREAST: No symptoms   Objective: BP 110/80   Ht 5\' 6"  (1.676 m)   Wt 159 lb (72.1 kg)   LMP 10/03/2018 (Approximate)   BMI 25.66 kg/m    Physical Exam Constitutional:      Appearance: She is well-developed.  Genitourinary:      Vulva, vagina, uterus, right adnexa and left adnexa normal.     No vulval lesion or tenderness noted.     No vaginal discharge, erythema or tenderness.     No cervical motion tenderness or polyp.     Uterus is not enlarged or tender.     No right or left adnexal mass present.     Right adnexa not tender.     Left adnexa not tender.  Neck:     Thyroid: No thyromegaly.  Cardiovascular:     Rate and Rhythm: Normal rate and regular rhythm.     Heart sounds: Normal heart sounds. No murmur heard.   Pulmonary:     Effort: Pulmonary effort is normal.  Breath sounds: Normal breath sounds.  Chest:     Breasts:        Right: No mass, nipple discharge, skin change or tenderness.        Left: No mass, nipple discharge, skin change or tenderness.  Abdominal:     Palpations: Abdomen is soft.     Tenderness: There is no abdominal tenderness. There is no guarding.  Musculoskeletal:        General: Normal range of motion.     Cervical back: Normal range of motion.  Neurological:     General: No focal deficit present.     Mental Status: She is alert and oriented to person, place, and time.     Cranial Nerves: No cranial nerve deficit.  Skin:    General: Skin is warm and dry.  Psychiatric:        Mood and Affect: Mood normal.        Behavior: Behavior normal.        Thought Content: Thought content normal.        Judgment: Judgment normal.  Vitals reviewed.     Assessment/Plan: Encounter for annual routine gynecological examination  Encounter for screening mammogram for malignant neoplasm of breast - Plan: MM 3D SCREEN BREAST BILATERAL; pt current on mammo  Family history of breast cancer--MyRisk testing discussed and pt to consider. F/u prn.   Screening for colon cancer - Plan: Ambulatory referral to Gastroenterology; refer to GI in [place] for colonoscopy due to age  Herpes simplex vulvovaginitis - Pt takes acyclovir prn sx; Rx RF.  - Plan: acyclovir (ZOVIRAX) 400 MG  tablet    Meds ordered this encounter  Medications  . acyclovir (ZOVIRAX) 400 MG tablet    Sig: Take 1 tablet (400 mg total) by mouth 2 (two) times daily. As needed    Dispense:  60 tablet    Refill:  1    Order Specific Question:   Supervising Provider    Answer:   Gae Dry [[id number]]             GYN counsel breast self exam, mammography screening, menopause, adequate intake of calcium and vitamin D, diet and exercise     F/U  Return in about 1 year (around 12/13/2020).  Rameen Gohlke B. Robyne Matar, PA-C 12/14/2019 4:08 PM

## 2020-01-11 DIAGNOSIS — Z131 Encounter for screening for diabetes mellitus: Secondary | ICD-10-CM | POA: Diagnosis not present

## 2020-01-11 DIAGNOSIS — Z13220 Encounter for screening for lipoid disorders: Secondary | ICD-10-CM | POA: Diagnosis not present

## 2020-01-11 DIAGNOSIS — Z0000 Encounter for general adult medical examination without abnormal findings: Secondary | ICD-10-CM | POA: Diagnosis not present

## 2020-01-11 DIAGNOSIS — F419 Anxiety disorder, unspecified: Secondary | ICD-10-CM | POA: Diagnosis not present

## 2020-02-20 DIAGNOSIS — M9904 Segmental and somatic dysfunction of sacral region: Secondary | ICD-10-CM | POA: Diagnosis not present

## 2020-02-20 DIAGNOSIS — M5386 Other specified dorsopathies, lumbar region: Secondary | ICD-10-CM | POA: Diagnosis not present

## 2020-02-20 DIAGNOSIS — M9902 Segmental and somatic dysfunction of thoracic region: Secondary | ICD-10-CM | POA: Diagnosis not present

## 2020-02-20 DIAGNOSIS — M62830 Muscle spasm of back: Secondary | ICD-10-CM | POA: Diagnosis not present

## 2020-02-20 DIAGNOSIS — M9903 Segmental and somatic dysfunction of lumbar region: Secondary | ICD-10-CM | POA: Diagnosis not present

## 2020-02-24 DIAGNOSIS — M9902 Segmental and somatic dysfunction of thoracic region: Secondary | ICD-10-CM | POA: Diagnosis not present

## 2020-02-24 DIAGNOSIS — M9903 Segmental and somatic dysfunction of lumbar region: Secondary | ICD-10-CM | POA: Diagnosis not present

## 2020-02-24 DIAGNOSIS — M9904 Segmental and somatic dysfunction of sacral region: Secondary | ICD-10-CM | POA: Diagnosis not present

## 2020-02-24 DIAGNOSIS — M62830 Muscle spasm of back: Secondary | ICD-10-CM | POA: Diagnosis not present

## 2020-02-24 DIAGNOSIS — M5386 Other specified dorsopathies, lumbar region: Secondary | ICD-10-CM | POA: Diagnosis not present

## 2020-02-26 ENCOUNTER — Encounter: Payer: Self-pay | Admitting: Gastroenterology

## 2020-03-25 DIAGNOSIS — J3489 Other specified disorders of nose and nasal sinuses: Secondary | ICD-10-CM | POA: Diagnosis not present

## 2020-03-25 DIAGNOSIS — U071 COVID-19: Secondary | ICD-10-CM | POA: Diagnosis not present

## 2020-03-25 DIAGNOSIS — R519 Headache, unspecified: Secondary | ICD-10-CM | POA: Diagnosis not present

## 2020-03-25 DIAGNOSIS — R059 Cough, unspecified: Secondary | ICD-10-CM | POA: Diagnosis not present

## 2020-04-19 ENCOUNTER — Encounter: Payer: 59 | Admitting: Gastroenterology

## 2020-06-01 DIAGNOSIS — R7303 Prediabetes: Secondary | ICD-10-CM | POA: Diagnosis not present

## 2020-06-01 DIAGNOSIS — F419 Anxiety disorder, unspecified: Secondary | ICD-10-CM | POA: Diagnosis not present

## 2020-12-19 ENCOUNTER — Ambulatory Visit: Payer: 59 | Admitting: Obstetrics and Gynecology

## 2021-01-13 DIAGNOSIS — Z0000 Encounter for general adult medical examination without abnormal findings: Secondary | ICD-10-CM | POA: Diagnosis not present

## 2021-01-13 DIAGNOSIS — R7303 Prediabetes: Secondary | ICD-10-CM | POA: Diagnosis not present

## 2021-01-17 DIAGNOSIS — Z13220 Encounter for screening for lipoid disorders: Secondary | ICD-10-CM | POA: Diagnosis not present

## 2021-01-17 DIAGNOSIS — Z131 Encounter for screening for diabetes mellitus: Secondary | ICD-10-CM | POA: Diagnosis not present

## 2021-01-17 DIAGNOSIS — Z0000 Encounter for general adult medical examination without abnormal findings: Secondary | ICD-10-CM | POA: Diagnosis not present

## 2021-01-18 ENCOUNTER — Other Ambulatory Visit (HOSPITAL_COMMUNITY)
Admission: RE | Admit: 2021-01-18 | Discharge: 2021-01-18 | Disposition: A | Discharge status: Home / Self Care | Payer: 59 | Source: Ambulatory Visit | Attending: Obstetrics and Gynecology | Admitting: Obstetrics and Gynecology

## 2021-01-18 ENCOUNTER — Encounter: Payer: Self-pay | Admitting: Obstetrics and Gynecology

## 2021-01-18 ENCOUNTER — Other Ambulatory Visit: Payer: Self-pay

## 2021-01-18 ENCOUNTER — Ambulatory Visit (INDEPENDENT_AMBULATORY_CARE_PROVIDER_SITE_OTHER): Payer: 59 | Admitting: Obstetrics and Gynecology

## 2021-01-18 VITALS — BP 120/80 | Ht 64.0 in | Wt 163.0 lb

## 2021-01-18 DIAGNOSIS — A6004 Herpesviral vulvovaginitis: Secondary | ICD-10-CM

## 2021-01-18 DIAGNOSIS — Z1151 Encounter for screening for human papillomavirus (HPV): Secondary | ICD-10-CM

## 2021-01-18 DIAGNOSIS — Z803 Family history of malignant neoplasm of breast: Secondary | ICD-10-CM | POA: Diagnosis not present

## 2021-01-18 DIAGNOSIS — Z8041 Family history of malignant neoplasm of ovary: Secondary | ICD-10-CM | POA: Diagnosis not present

## 2021-01-18 DIAGNOSIS — Z1231 Encounter for screening mammogram for malignant neoplasm of breast: Secondary | ICD-10-CM

## 2021-01-18 DIAGNOSIS — Z1211 Encounter for screening for malignant neoplasm of colon: Secondary | ICD-10-CM | POA: Diagnosis not present

## 2021-01-18 DIAGNOSIS — Z808 Family history of malignant neoplasm of other organs or systems: Secondary | ICD-10-CM | POA: Diagnosis not present

## 2021-01-18 DIAGNOSIS — Z124 Encounter for screening for malignant neoplasm of cervix: Secondary | ICD-10-CM | POA: Diagnosis not present

## 2021-01-18 DIAGNOSIS — Z01419 Encounter for gynecological examination (general) (routine) without abnormal findings: Secondary | ICD-10-CM | POA: Diagnosis not present

## 2021-01-18 NOTE — Patient Instructions (Signed)
I value your feedback and you entrusting us with your care. If you get a [facility] patient survey, I would appreciate you taking the time to let us know about your experience today. Thank you!  [facility]: [phone]      

## 2021-01-18 NOTE — Progress Notes (Signed)
PCP:  Patient, No Pcp Per (Inactive)   Chief Complaint  Patient presents with   Gynecologic Exam    No concerns     HPI:      Ms. Christine Hoffman is a 50 y.o. G1P1 who LMP was Patient's last menstrual period was 10/03/2018 (approximate)., presents today for her annual examination.  Her menses are absent since 7/20, had been regular every 28-30 days, lasting 2-3 days, very light. S/p endometrial ablation. Also having tolerable hot flashes since 7/20.  Sex activity: single partner, contraception - tubal ligation. No pain/bleeding Last Pap: 08/15/17 Results were: no abnormalities /neg HPV DNA  Hx of STDs: HSV, takes acyclovir prn sx, but no recent outbreaks; doesn't need RF  Last mammogram: 07/08/19  Results were: normal--routine follow-up in 12 months There is a FH of breast cancer in her mother and pat aunt. There is a FH of ovarian cancer in her sister's daughter/pt's niece and pancreatic cancer (thought was prostate but really pancreatic) in her brother. Genetic testing not done in any family member and pt declined in past.  The patient does not do self-breast exams.  Tobacco use: The patient denies current or previous tobacco use. Alcohol use: none No drug use.  Exercise: mod active  Colonoscopy: never; has GI ref through PCP  She does not get adequate calcium but does get Vitamin D in her diet. Labs with PCP. Hx of Vit D deficiency. Recently diagnosed with benign positional vertigo.  Past Medical History:  Diagnosis Date   Gastritis 2004   GERD (gastroesophageal reflux disease)    Herpes    Hypertension 2012   Lump or mass in breast    Vertigo     Past Surgical History:  Procedure Laterality Date   ABLATION  2010   BREAST BIOPSY Left 10/08/11   pseudo-angiomatous stromal hyperplasia without atypia on core biopsy.   TUBAL LIGATION  2010    Family History  Problem Relation Age of Onset   Breast cancer Mother 37   Hypertension Mother    Parkinson's  disease Father    Pancreatic cancer Brother 12   Breast cancer Paternal Aunt 14   Ovarian cancer Other 4       sister's daughter    Social History   Socioeconomic History   Marital status: Single    Spouse name: Not on file   Number of children: Not on file   Years of education: Not on file   Highest education level: Not on file  Occupational History   Not on file  Tobacco Use   Smoking status: Never   Smokeless tobacco: Never  Vaping Use   Vaping Use: Never used  Substance and Sexual Activity   Alcohol use: Yes   Drug use: No   Sexual activity: Yes    Birth control/protection: Surgical    Comment: Tubal ligation  Other Topics Concern   Not on file  Social History Narrative   Not on file   Social Determinants of Health   Financial Resource Strain: Not on file  Food Insecurity: Not on file  Transportation Needs: Not on file  Physical Activity: Not on file  Stress: Not on file  Social Connections: Not on file  Intimate Partner Violence: Not on file    Outpatient Medications Prior to Visit  Medication Sig Dispense Refill   acyclovir (ZOVIRAX) 400 MG tablet Take 1 tablet (400 mg total) by mouth 2 (two) times daily. As needed 60 tablet 1   ALPRAZolam (  XANAX) 0.25 MG tablet      meclizine (ANTIVERT) 32 MG tablet Take 1 tablet (32 mg total) by mouth 3 (three) times daily as needed. 30 tablet 11   ondansetron (ZOFRAN) 4 MG tablet Take 1 tablet (4 mg total) by mouth every 8 (eight) hours as needed for nausea or vomiting. 20 tablet 0   ergocalciferol (VITAMIN D2) 50000 units capsule Take 1 capsule (50,000 Units total) by mouth once a week. (Patient not taking: No sig reported) 12 capsule 0   No facility-administered medications prior to visit.      ROS:  Review of Systems  Constitutional:  Negative for fatigue, fever and unexpected weight change.  Respiratory:  Negative for cough, shortness of breath and wheezing.   Cardiovascular:  Negative for chest pain,  palpitations and leg swelling.  Gastrointestinal:  Negative for blood in stool, constipation, diarrhea, nausea and vomiting.  Endocrine: Negative for cold intolerance, heat intolerance and polyuria.  Genitourinary:  Negative for dyspareunia, dysuria, flank pain, frequency, genital sores, hematuria, menstrual problem, pelvic pain, urgency, vaginal bleeding, vaginal discharge and vaginal pain.  Musculoskeletal:  Negative for back pain, joint swelling and myalgias.  Skin:  Negative for rash.  Neurological:  Positive for dizziness and headaches. Negative for syncope, light-headedness and numbness.  Hematological:  Negative for adenopathy.  Psychiatric/Behavioral:  Negative for agitation, confusion, sleep disturbance and suicidal ideas. The patient is not nervous/anxious.  BREAST: No symptoms   Objective: BP 120/80   Ht 5' 4"  (1.626 m)   Wt 163 lb (73.9 kg)   LMP 10/03/2018 (Approximate)   BMI 27.98 kg/m    Physical Exam Constitutional:      Appearance: She is well-developed.  Genitourinary:     Vulva normal.     No vaginal discharge, erythema or tenderness.      Right Adnexa: not tender and no mass present.    Left Adnexa: not tender and no mass present.    No cervical motion tenderness or polyp.     Uterus is not enlarged or tender.  Breasts:    Right: No mass, nipple discharge, skin change or tenderness.     Left: No mass, nipple discharge, skin change or tenderness.  Neck:     Thyroid: No thyromegaly.  Cardiovascular:     Rate and Rhythm: Normal rate and regular rhythm.     Heart sounds: Normal heart sounds. No murmur heard. Pulmonary:     Effort: Pulmonary effort is normal.     Breath sounds: Normal breath sounds.  Abdominal:     Palpations: Abdomen is soft.     Tenderness: There is no abdominal tenderness. There is no guarding.  Musculoskeletal:        General: Normal range of motion.     Cervical back: Normal range of motion.  Neurological:     General: No focal  deficit present.     Mental Status: She is alert and oriented to person, place, and time.     Cranial Nerves: No cranial nerve deficit.  Skin:    General: Skin is warm and dry.  Psychiatric:        Mood and Affect: Mood normal.        Behavior: Behavior normal.        Thought Content: Thought content normal.        Judgment: Judgment normal.  Vitals reviewed.    Assessment/Plan: Encounter for annual routine gynecological examination  Cervical cancer screening - Plan: Cytology - PAP  Screening for  HPV (human papillomavirus) - Plan: Cytology - PAP  Encounter for screening mammogram for malignant neoplasm of breast - Plan: MM 3D SCREEN BREAST BILATERAL; pt to sched mammo  Family history of breast cancer - Plan: MM 3D SCREEN BREAST BILATERAL, [facility] ([place]); MyRisk testing discussed and done today. Will call with results.   Screening for colon cancer--has ref through PCP.   Herpes simplex vulvovaginitis--will call for acyclovir RF prn.              GYN counsel breast self exam, mammography screening, menopause, adequate intake of calcium and vitamin D, diet and exercise     F/U  Return in about 1 year (around 01/18/2022).  Eschol Auxier B. Shantai Tiedeman, PA-C 01/18/2021 2:15 PM

## 2021-01-20 LAB — CYTOLOGY - PAP
Comment: NEGATIVE
Diagnosis: NEGATIVE
High risk HPV: NEGATIVE

## 2021-01-24 DIAGNOSIS — Z1371 Encounter for nonprocreative screening for genetic disease carrier status: Secondary | ICD-10-CM

## 2021-01-24 DIAGNOSIS — Z9189 Other specified personal risk factors, not elsewhere classified: Secondary | ICD-10-CM

## 2021-01-24 HISTORY — DX: Other specified personal risk factors, not elsewhere classified: Z91.89

## 2021-01-24 HISTORY — DX: Encounter for nonprocreative screening for genetic disease carrier status: Z13.71

## 2021-02-20 ENCOUNTER — Encounter: Payer: Self-pay | Admitting: Obstetrics and Gynecology

## 2021-02-27 ENCOUNTER — Telehealth: Payer: Self-pay | Admitting: Obstetrics and Gynecology

## 2021-02-27 NOTE — Telephone Encounter (Signed)
Pt aware of neg MyRIsk testing except TSC1 and TSC2 VUS. Received letter from [facility] that they would like to do addl teseting on the variant and need pt's permission as well as extra blood sample. Pt aware and will reach out to [facility] if she wants to pursue.  IBIS=21.9%/riskscore=18.4%. Pt aware of monthly SBE, yearly CBE and mammos as well as scr breast MRI. Pt will sched mammo and then f/u for MRI ref if desires.   Patient understands these results only apply to her and her children, and this is not indicative of genetic testing results of her other family members. It is recommended that her other family members have genetic testing done.  Pt also understands negative genetic testing doesn't mean she will never get any of these cancers.   Hard copy mailed to pt. F/u prn.   

## 2021-03-07 ENCOUNTER — Ambulatory Visit: Payer: 59 | Attending: Internal Medicine

## 2021-03-07 ENCOUNTER — Other Ambulatory Visit: Payer: Self-pay

## 2021-03-07 DIAGNOSIS — Z23 Encounter for immunization: Secondary | ICD-10-CM

## 2021-03-07 MED ORDER — PFIZER COVID-19 VAC BIVALENT 30 MCG/0.3ML IM SUSP
INTRAMUSCULAR | 0 refills | Status: DC
  Filled 2021-03-07: qty 0.3, 1d supply, fill #0

## 2021-03-07 NOTE — Progress Notes (Signed)
° °  Covid-19 Vaccination Clinic  Name:  Christine Hoffman    MRN: [id number] DOB: 09-01-1970  03/07/2021  [name] was observed post Covid-19 immunization for 15 minutes without incident. She was provided with Vaccine Information Sheet and instruction to access the V-Safe system.   [name] was instructed to call 911 with any severe reactions post vaccine: Difficulty breathing  Swelling of face and throat  A fast heartbeat  A bad rash all over body  Dizziness and weakness   Immunizations Administered     Name Date Dose VIS Date Route   Pfizer Covid-19 Vaccine Bivalent Booster 03/07/2021 12:28 PM 0.3 mL 11/23/2020 Intramuscular   Manufacturer: [place]   Lot: [id number]   [place]: [place], PharmD, MBA Clinical Acute Care Pharmacist

## 2021-04-08 DIAGNOSIS — F411 Generalized anxiety disorder: Secondary | ICD-10-CM | POA: Diagnosis not present

## 2021-04-20 ENCOUNTER — Other Ambulatory Visit (HOSPITAL_COMMUNITY): Payer: Self-pay

## 2021-09-13 ENCOUNTER — Other Ambulatory Visit (HOSPITAL_BASED_OUTPATIENT_CLINIC_OR_DEPARTMENT_OTHER): Payer: Self-pay

## 2021-12-20 DIAGNOSIS — H8110 Benign paroxysmal vertigo, unspecified ear: Secondary | ICD-10-CM | POA: Diagnosis not present

## 2021-12-20 DIAGNOSIS — R7301 Impaired fasting glucose: Secondary | ICD-10-CM | POA: Diagnosis not present

## 2021-12-20 DIAGNOSIS — R110 Nausea: Secondary | ICD-10-CM | POA: Diagnosis not present

## 2021-12-20 DIAGNOSIS — Z79899 Other long term (current) drug therapy: Secondary | ICD-10-CM | POA: Diagnosis not present

## 2021-12-20 DIAGNOSIS — R5383 Other fatigue: Secondary | ICD-10-CM | POA: Diagnosis not present

## 2022-01-18 DIAGNOSIS — H8111 Benign paroxysmal vertigo, right ear: Secondary | ICD-10-CM | POA: Diagnosis not present

## 2022-01-18 DIAGNOSIS — R42 Dizziness and giddiness: Secondary | ICD-10-CM | POA: Diagnosis not present

## 2022-02-22 DIAGNOSIS — E038 Other specified hypothyroidism: Secondary | ICD-10-CM | POA: Diagnosis not present

## 2022-03-02 DIAGNOSIS — Z131 Encounter for screening for diabetes mellitus: Secondary | ICD-10-CM | POA: Diagnosis not present

## 2022-03-02 DIAGNOSIS — F419 Anxiety disorder, unspecified: Secondary | ICD-10-CM | POA: Diagnosis not present

## 2022-03-02 DIAGNOSIS — Z0000 Encounter for general adult medical examination without abnormal findings: Secondary | ICD-10-CM | POA: Diagnosis not present

## 2022-03-02 DIAGNOSIS — Z13220 Encounter for screening for lipoid disorders: Secondary | ICD-10-CM | POA: Diagnosis not present

## 2022-06-20 NOTE — Progress Notes (Unsigned)
PCP:  Patient, No Pcp Per   No chief complaint on file.    HPI:      Ms. Christine Hoffman is a 52 y.o. G1P1 who LMP was Patient's last menstrual period was 10/03/2018 (approximate)., presents today for her annual examination.  Her menses are absent since 7/20, had been regular every 28-30 days, lasting 2-3 days, very light. S/p endometrial ablation. Also having tolerable hot flashes since 7/20.  Sex activity: single partner, contraception - tubal ligation. No pain/bleeding Last Pap: 01/18/21 Results were: no abnormalities /neg HPV DNA  Hx of STDs: HSV, takes acyclovir prn sx, but no recent outbreaks; doesn't need RF  Last mammogram: 07/08/19  Results were: normal--routine follow-up in 12 months There is a FH of breast cancer in her mother and pat aunt. There is a FH of ovarian cancer in her sister's daughter/pt's niece and pancreatic cancer (thought was prostate but really pancreatic) in her brother. Genetic testing not done in any family member and pt declined in past.  The patient does not do self-breast exams.  Tobacco use: The patient denies current or previous tobacco use. Alcohol use: none No drug use.  Exercise: mod active  Colonoscopy: never; has GI ref through PCP  She does not get adequate calcium but does get Vitamin D in her diet. Labs with PCP. Hx of Vit D deficiency. Recently diagnosed with benign positional vertigo.  Past Medical History:  Diagnosis Date   BRCA negative 01/2021   MyRisk neg except TSC1 and TSC2 VUS   Family history of breast cancer    Family history of pancreatic cancer    half brother   Gastritis 2004   GERD (gastroesophageal reflux disease)    Herpes    Hypertension 2012   Increased risk of breast cancer 01/2021   IBIS=21.9%/riskscore=18.4%   Lump or mass in breast    Vertigo     Past Surgical History:  Procedure Laterality Date   ABLATION  2010   BREAST BIOPSY Left 10/08/11   pseudo-angiomatous stromal hyperplasia  without atypia on core biopsy.   TUBAL LIGATION  2010    Family History  Problem Relation Age of Onset   Breast cancer Mother 19   Hypertension Mother    Parkinson's disease Father    Pancreatic cancer Brother 61   Breast cancer Paternal Aunt 18   Ovarian cancer Other 73       sister's daughter    Social History   Socioeconomic History   Marital status: Single    Spouse name: Not on file   Number of children: Not on file   Years of education: Not on file   Highest education level: Not on file  Occupational History   Not on file  Tobacco Use   Smoking status: Never   Smokeless tobacco: Never  Vaping Use   Vaping Use: Never used  Substance and Sexual Activity   Alcohol use: Yes   Drug use: No   Sexual activity: Yes    Birth control/protection: Surgical    Comment: Tubal ligation  Other Topics Concern   Not on file  Social History Narrative   Not on file   Social Determinants of Health   Financial Resource Strain: Not on file  Food Insecurity: Not on file  Transportation Needs: Not on file  Physical Activity: Not on file  Stress: Not on file  Social Connections: Not on file  Intimate Partner Violence: Not on file    Outpatient Medications Prior to  Visit  Medication Sig Dispense Refill   acyclovir (ZOVIRAX) 400 MG tablet Take 1 tablet (400 mg total) by mouth 2 (two) times daily. As needed 60 tablet 1   ALPRAZolam (XANAX) 0.25 MG tablet      COVID-19 mRNA bivalent vaccine, Pfizer, (PFIZER COVID-19 VAC BIVALENT) injection Inject into the muscle. 0.3 mL 0   ergocalciferol (VITAMIN D2) 50000 units capsule Take 1 capsule (50,000 Units total) by mouth once a week. (Patient not taking: No sig reported) 12 capsule 0   meclizine (ANTIVERT) 32 MG tablet Take 1 tablet (32 mg total) by mouth 3 (three) times daily as needed. 30 tablet 11   ondansetron (ZOFRAN) 4 MG tablet Take 1 tablet (4 mg total) by mouth every 8 (eight) hours as needed for nausea or vomiting. 20 tablet 0    No facility-administered medications prior to visit.      ROS:  Review of Systems  Constitutional:  Negative for fatigue, fever and unexpected weight change.  Respiratory:  Negative for cough, shortness of breath and wheezing.   Cardiovascular:  Negative for chest pain, palpitations and leg swelling.  Gastrointestinal:  Negative for blood in stool, constipation, diarrhea, nausea and vomiting.  Endocrine: Negative for cold intolerance, heat intolerance and polyuria.  Genitourinary:  Negative for dyspareunia, dysuria, flank pain, frequency, genital sores, hematuria, menstrual problem, pelvic pain, urgency, vaginal bleeding, vaginal discharge and vaginal pain.  Musculoskeletal:  Negative for back pain, joint swelling and myalgias.  Skin:  Negative for rash.  Neurological:  Positive for dizziness and headaches. Negative for syncope, light-headedness and numbness.  Hematological:  Negative for adenopathy.  Psychiatric/Behavioral:  Negative for agitation, confusion, sleep disturbance and suicidal ideas. The patient is not nervous/anxious.   BREAST: No symptoms   Objective: LMP 10/03/2018 (Approximate)    Physical Exam Constitutional:      Appearance: She is well-developed.  Genitourinary:     Vulva normal.     No vaginal discharge, erythema or tenderness.      Right Adnexa: not tender and no mass present.    Left Adnexa: not tender and no mass present.    No cervical motion tenderness or polyp.     Uterus is not enlarged or tender.  Breasts:    Right: No mass, nipple discharge, skin change or tenderness.     Left: No mass, nipple discharge, skin change or tenderness.  Neck:     Thyroid: No thyromegaly.  Cardiovascular:     Rate and Rhythm: Normal rate and regular rhythm.     Heart sounds: Normal heart sounds. No murmur heard. Pulmonary:     Effort: Pulmonary effort is normal.     Breath sounds: Normal breath sounds.  Abdominal:     Palpations: Abdomen is soft.      Tenderness: There is no abdominal tenderness. There is no guarding.  Musculoskeletal:        General: Normal range of motion.     Cervical back: Normal range of motion.  Neurological:     General: No focal deficit present.     Mental Status: She is alert and oriented to person, place, and time.     Cranial Nerves: No cranial nerve deficit.  Skin:    General: Skin is warm and dry.  Psychiatric:        Mood and Affect: Mood normal.        Behavior: Behavior normal.        Thought Content: Thought content normal.  Judgment: Judgment normal.  Vitals reviewed.     Assessment/Plan: Encounter for annual routine gynecological examination  Cervical cancer screening - Plan: Cytology - PAP  Screening for HPV (human papillomavirus) - Plan: Cytology - PAP  Encounter for screening mammogram for malignant neoplasm of breast - Plan: MM 3D SCREEN BREAST BILATERAL; pt to sched mammo  Family history of breast cancer - Plan: MM 3D SCREEN BREAST BILATERAL, Integrated [facility] ([place]); MyRisk testing discussed and done today. Will call with results.   Screening for colon cancer--has ref through PCP.   Herpes simplex vulvovaginitis--will call for acyclovir RF prn.              GYN counsel breast self exam, mammography screening, menopause, adequate intake of calcium and vitamin D, diet and exercise     F/U  No follow-ups on file.  Emya Picado B. Sinahi Knights, PA-C 06/20/2022 5:54 PM

## 2022-06-21 ENCOUNTER — Encounter: Payer: Self-pay | Admitting: Obstetrics and Gynecology

## 2022-06-21 ENCOUNTER — Ambulatory Visit (INDEPENDENT_AMBULATORY_CARE_PROVIDER_SITE_OTHER): Payer: Commercial Managed Care - PPO | Admitting: Obstetrics and Gynecology

## 2022-06-21 VITALS — BP 110/70 | Ht 64.0 in | Wt 160.0 lb

## 2022-06-21 DIAGNOSIS — A6004 Herpesviral vulvovaginitis: Secondary | ICD-10-CM

## 2022-06-21 DIAGNOSIS — Z1211 Encounter for screening for malignant neoplasm of colon: Secondary | ICD-10-CM

## 2022-06-21 DIAGNOSIS — Z9189 Other specified personal risk factors, not elsewhere classified: Secondary | ICD-10-CM | POA: Insufficient documentation

## 2022-06-21 DIAGNOSIS — Z01419 Encounter for gynecological examination (general) (routine) without abnormal findings: Secondary | ICD-10-CM

## 2022-06-21 DIAGNOSIS — Z1231 Encounter for screening mammogram for malignant neoplasm of breast: Secondary | ICD-10-CM

## 2022-06-21 DIAGNOSIS — Z803 Family history of malignant neoplasm of breast: Secondary | ICD-10-CM

## 2022-06-21 MED ORDER — ACYCLOVIR 400 MG PO TABS: 400.0000 mg | ORAL_TABLET | Freq: Four times a day (QID) | ORAL | 1 refills | Status: AC

## 2022-06-21 NOTE — Patient Instructions (Signed)
I value your feedback and you entrusting us with your care. If you get a [facility] patient survey, I would appreciate you taking the time to let us know about your experience today. Thank you!  [facility]: [phone]      

## 2022-07-02 ENCOUNTER — Other Ambulatory Visit (HOSPITAL_COMMUNITY): Payer: Self-pay

## 2022-07-02 ENCOUNTER — Other Ambulatory Visit: Payer: Self-pay

## 2022-07-02 MED ORDER — ALPRAZOLAM 0.25 MG PO TABS
0.2500 mg | ORAL_TABLET | ORAL | 0 refills | Status: AC | PRN
  Filled 2022-07-02: qty 10, 30d supply, fill #0

## 2022-07-09 ENCOUNTER — Other Ambulatory Visit (HOSPITAL_COMMUNITY): Payer: Self-pay

## 2022-07-09 MED ORDER — PEG 3350-KCL-NA BICARB-NACL 420 G PO SOLR
ORAL | 0 refills | Status: AC
  Filled 2022-07-09: qty 4000, 1d supply, fill #0

## 2022-07-16 DIAGNOSIS — K648 Other hemorrhoids: Secondary | ICD-10-CM | POA: Diagnosis not present

## 2022-07-16 DIAGNOSIS — Z1211 Encounter for screening for malignant neoplasm of colon: Secondary | ICD-10-CM | POA: Diagnosis not present

## 2022-07-24 ENCOUNTER — Other Ambulatory Visit (HOSPITAL_COMMUNITY): Payer: Self-pay

## 2022-07-24 MED ORDER — ALPRAZOLAM 0.25 MG PO TABS
0.2500 mg | ORAL_TABLET | Freq: Every day | ORAL | 1 refills | Status: AC
  Filled 2022-07-30: qty 15, 30d supply, fill #0

## 2022-07-26 ENCOUNTER — Other Ambulatory Visit (HOSPITAL_COMMUNITY): Payer: Self-pay

## 2022-07-30 ENCOUNTER — Other Ambulatory Visit: Payer: Self-pay

## 2022-07-30 ENCOUNTER — Other Ambulatory Visit (HOSPITAL_COMMUNITY): Payer: Self-pay

## 2022-08-17 ENCOUNTER — Other Ambulatory Visit: Payer: Self-pay | Admitting: Obstetrics and Gynecology

## 2022-08-17 DIAGNOSIS — R030 Elevated blood-pressure reading, without diagnosis of hypertension: Secondary | ICD-10-CM | POA: Diagnosis not present

## 2022-08-17 DIAGNOSIS — Z1231 Encounter for screening mammogram for malignant neoplasm of breast: Secondary | ICD-10-CM

## 2022-08-17 DIAGNOSIS — F4323 Adjustment disorder with mixed anxiety and depressed mood: Secondary | ICD-10-CM | POA: Diagnosis not present

## 2022-09-06 ENCOUNTER — Ambulatory Visit
Admission: RE | Admit: 2022-09-06 | Discharge: 2022-09-06 | Disposition: A | Discharge status: Home / Self Care | Payer: Commercial Managed Care - PPO | Source: Ambulatory Visit | Attending: Obstetrics and Gynecology | Admitting: Obstetrics and Gynecology

## 2022-09-06 DIAGNOSIS — Z1231 Encounter for screening mammogram for malignant neoplasm of breast: Secondary | ICD-10-CM

## 2022-09-07 DIAGNOSIS — R7303 Prediabetes: Secondary | ICD-10-CM | POA: Diagnosis not present

## 2022-09-21 ENCOUNTER — Other Ambulatory Visit (HOSPITAL_BASED_OUTPATIENT_CLINIC_OR_DEPARTMENT_OTHER): Payer: Self-pay

## 2023-01-09 ENCOUNTER — Other Ambulatory Visit (HOSPITAL_COMMUNITY): Payer: Self-pay

## 2023-01-09 MED ORDER — VITAMIN D (ERGOCALCIFEROL) 1.25 MG (50000 UNIT) PO CAPS
ORAL_CAPSULE | ORAL | 3 refills | Status: AC
  Filled 2023-01-09: qty 4, 28d supply, fill #0

## 2023-01-09 MED ORDER — ALPRAZOLAM 0.25 MG PO TABS
0.2500 mg | ORAL_TABLET | Freq: Every day | ORAL | 1 refills | Status: AC | PRN
  Filled 2023-01-09: qty 15, 30d supply, fill #0

## 2023-01-10 ENCOUNTER — Other Ambulatory Visit (HOSPITAL_COMMUNITY): Payer: Self-pay

## 2023-03-07 DIAGNOSIS — F419 Anxiety disorder, unspecified: Secondary | ICD-10-CM | POA: Diagnosis not present

## 2023-03-07 DIAGNOSIS — Z13220 Encounter for screening for lipoid disorders: Secondary | ICD-10-CM | POA: Diagnosis not present

## 2023-03-07 DIAGNOSIS — Z0000 Encounter for general adult medical examination without abnormal findings: Secondary | ICD-10-CM | POA: Diagnosis not present

## 2023-03-07 DIAGNOSIS — Z23 Encounter for immunization: Secondary | ICD-10-CM | POA: Diagnosis not present

## 2023-03-07 DIAGNOSIS — R7303 Prediabetes: Secondary | ICD-10-CM | POA: Diagnosis not present

## 2023-05-07 ENCOUNTER — Other Ambulatory Visit (HOSPITAL_COMMUNITY): Payer: Self-pay

## 2023-05-07 MED ORDER — ALPRAZOLAM 0.25 MG PO TABS
0.2500 mg | ORAL_TABLET | Freq: Every day | ORAL | 1 refills | Status: AC | PRN
  Filled 2023-05-07: qty 30, 30d supply, fill #0
  Filled 2023-07-26: qty 15, 30d supply, fill #0

## 2023-05-07 MED ORDER — VITAMIN D (ERGOCALCIFEROL) 1.25 MG (50000 UNIT) PO CAPS
50000.0000 [IU] | ORAL_CAPSULE | ORAL | 11 refills | Status: AC
  Filled 2023-05-07 – 2023-10-08 (×2): qty 4, 28d supply, fill #0

## 2023-06-25 ENCOUNTER — Other Ambulatory Visit (HOSPITAL_COMMUNITY): Payer: Self-pay

## 2023-06-25 MED ORDER — ALPRAZOLAM 0.25 MG PO TABS
0.2500 mg | ORAL_TABLET | Freq: Every day | ORAL | 1 refills | Status: AC
  Filled 2023-06-25: qty 15, 30d supply, fill #0
  Filled 2023-10-08: qty 15, 30d supply, fill #1

## 2023-06-28 ENCOUNTER — Other Ambulatory Visit (HOSPITAL_COMMUNITY): Payer: Self-pay

## 2023-07-24 ENCOUNTER — Other Ambulatory Visit (HOSPITAL_COMMUNITY): Payer: Self-pay

## 2023-07-26 ENCOUNTER — Other Ambulatory Visit (HOSPITAL_COMMUNITY): Payer: Self-pay

## 2023-09-06 ENCOUNTER — Other Ambulatory Visit (HOSPITAL_COMMUNITY): Payer: Self-pay

## 2023-09-06 ENCOUNTER — Other Ambulatory Visit: Payer: Self-pay

## 2023-09-23 ENCOUNTER — Other Ambulatory Visit (HOSPITAL_COMMUNITY): Payer: Self-pay

## 2023-09-26 ENCOUNTER — Other Ambulatory Visit: Payer: Self-pay | Admitting: Obstetrics and Gynecology

## 2023-09-26 DIAGNOSIS — Z1231 Encounter for screening mammogram for malignant neoplasm of breast: Secondary | ICD-10-CM

## 2023-10-04 ENCOUNTER — Ambulatory Visit
Admission: RE | Admit: 2023-10-04 | Discharge: 2023-10-04 | Disposition: A | Discharge status: Home / Self Care | Source: Ambulatory Visit | Attending: Obstetrics and Gynecology | Admitting: Obstetrics and Gynecology

## 2023-10-04 DIAGNOSIS — Z1231 Encounter for screening mammogram for malignant neoplasm of breast: Secondary | ICD-10-CM

## 2023-10-08 ENCOUNTER — Other Ambulatory Visit (HOSPITAL_BASED_OUTPATIENT_CLINIC_OR_DEPARTMENT_OTHER): Payer: Self-pay

## 2023-10-08 ENCOUNTER — Other Ambulatory Visit (HOSPITAL_COMMUNITY): Payer: Self-pay

## 2023-10-08 ENCOUNTER — Other Ambulatory Visit: Payer: Self-pay

## 2023-10-09 ENCOUNTER — Ambulatory Visit: Payer: Self-pay | Admitting: Obstetrics and Gynecology

## 2023-11-23 ENCOUNTER — Other Ambulatory Visit (HOSPITAL_COMMUNITY): Payer: Self-pay

## 2023-11-26 ENCOUNTER — Other Ambulatory Visit (HOSPITAL_COMMUNITY): Payer: Self-pay

## 2023-11-26 MED ORDER — ALPRAZOLAM 0.25 MG PO TABS
0.2500 mg | ORAL_TABLET | Freq: Every day | ORAL | 1 refills | Status: AC | PRN
  Filled 2023-11-26: qty 15, 15d supply, fill #0
  Filled 2023-12-19: qty 15, 15d supply, fill #1

## 2023-11-26 MED ORDER — VITAMIN D (ERGOCALCIFEROL) 1.25 MG (50000 UNIT) PO CAPS
50000.0000 [IU] | ORAL_CAPSULE | ORAL | 11 refills | Status: AC
  Filled 2023-11-26: qty 4, 28d supply, fill #0

## 2023-12-19 ENCOUNTER — Other Ambulatory Visit: Payer: Self-pay

## 2024-02-07 DIAGNOSIS — R079 Chest pain, unspecified: Secondary | ICD-10-CM | POA: Diagnosis not present

## 2024-02-07 DIAGNOSIS — M25512 Pain in left shoulder: Secondary | ICD-10-CM | POA: Diagnosis not present

## 2024-02-11 DIAGNOSIS — M79602 Pain in left arm: Secondary | ICD-10-CM | POA: Diagnosis not present

## 2024-02-11 DIAGNOSIS — M7542 Impingement syndrome of left shoulder: Secondary | ICD-10-CM | POA: Diagnosis not present

## 2024-03-20 DIAGNOSIS — Z139 Encounter for screening, unspecified: Secondary | ICD-10-CM | POA: Diagnosis not present

## 2024-03-20 DIAGNOSIS — R7303 Prediabetes: Secondary | ICD-10-CM | POA: Diagnosis not present

## 2024-03-20 DIAGNOSIS — E038 Other specified hypothyroidism: Secondary | ICD-10-CM | POA: Diagnosis not present

## 2024-03-20 DIAGNOSIS — E785 Hyperlipidemia, unspecified: Secondary | ICD-10-CM | POA: Diagnosis not present
